# Patient Record
Sex: Female | Born: 1995 | Race: Black or African American | Hispanic: No | Marital: Single | Smoking: Never smoker
Health system: Southern US, Community
[De-identification: ages and names within clinical notes are randomized; demographics above are authoritative.]

## PROBLEM LIST (undated history)

## (undated) ENCOUNTER — Inpatient Hospital Stay (HOSPITAL_COMMUNITY): Payer: Self-pay

## (undated) DIAGNOSIS — Z62810 Personal history of physical and sexual abuse in childhood: Secondary | ICD-10-CM

## (undated) DIAGNOSIS — O358XX0 Maternal care for other (suspected) fetal abnormality and damage, not applicable or unspecified: Secondary | ICD-10-CM

## (undated) DIAGNOSIS — O1403 Mild to moderate pre-eclampsia, third trimester: Secondary | ICD-10-CM

## (undated) DIAGNOSIS — Z789 Other specified health status: Secondary | ICD-10-CM

## (undated) DIAGNOSIS — Z3490 Encounter for supervision of normal pregnancy, unspecified, unspecified trimester: Secondary | ICD-10-CM

## (undated) DIAGNOSIS — O163 Unspecified maternal hypertension, third trimester: Secondary | ICD-10-CM

## (undated) DIAGNOSIS — D649 Anemia, unspecified: Secondary | ICD-10-CM

## (undated) HISTORY — DX: Encounter for supervision of normal pregnancy, unspecified, unspecified trimester: Z34.90

## (undated) HISTORY — DX: Mild to moderate pre-eclampsia, third trimester: O14.03

## (undated) HISTORY — DX: Anemia, unspecified: D64.9

## (undated) HISTORY — DX: Personal history of physical and sexual abuse in childhood: Z62.810

## (undated) HISTORY — PX: OTHER SURGICAL HISTORY: SHX169

## (undated) HISTORY — PX: NO PAST SURGERIES: SHX2092

## (undated) HISTORY — DX: Unspecified maternal hypertension, third trimester: O16.3

## (undated) HISTORY — DX: Maternal care for other (suspected) fetal abnormality and damage, not applicable or unspecified: O35.8XX0

---
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
## 2016-11-24 ENCOUNTER — Ambulatory Visit (INDEPENDENT_AMBULATORY_CARE_PROVIDER_SITE_OTHER): Payer: Medicaid Other | Admitting: Physician Assistant

## 2016-11-24 ENCOUNTER — Encounter (INDEPENDENT_AMBULATORY_CARE_PROVIDER_SITE_OTHER): Payer: Self-pay | Admitting: Physician Assistant

## 2016-11-24 VITALS — BP 132/70 | HR 96 | Temp 97.9°F | Ht 70.87 in | Wt 172.0 lb

## 2016-11-24 DIAGNOSIS — Z0000 Encounter for general adult medical examination without abnormal findings: Secondary | ICD-10-CM

## 2016-11-24 DIAGNOSIS — Z23 Encounter for immunization: Secondary | ICD-10-CM

## 2016-11-24 DIAGNOSIS — K5900 Constipation, unspecified: Secondary | ICD-10-CM

## 2016-11-24 DIAGNOSIS — R42 Dizziness and giddiness: Secondary | ICD-10-CM | POA: Diagnosis not present

## 2016-11-24 DIAGNOSIS — Z309 Encounter for contraceptive management, unspecified: Secondary | ICD-10-CM

## 2016-11-24 MED ORDER — TETANUS-DIPHTH-ACELL PERTUSSIS 5-2-15.5 LF-MCG/0.5 IM SUSP: 0.5000 mL | Freq: Once | INTRAMUSCULAR | 0 refills | Status: AC

## 2016-11-24 MED ORDER — DOCUSATE SODIUM 50 MG PO CAPS: 50.0000 mg | ORAL_CAPSULE | Freq: Two times a day (BID) | ORAL | 0 refills | Status: DC

## 2016-11-24 MED ORDER — INFLUENZA VIRUS VACC SPLIT PF (FLUZONE) 0.5 ML IM SUSY: 1.0000 "application " | PREFILLED_SYRINGE | Freq: Once | INTRAMUSCULAR | 0 refills | Status: AC

## 2016-11-24 NOTE — Patient Instructions (Signed)
Constipation, Adult °Constipation is when a person: °· Poops (has a bowel movement) fewer times in a week than normal. °· Has a hard time pooping. °· Has poop that is dry, hard, or bigger than normal. ° °Follow these instructions at home: °Eating and drinking ° °· Eat foods that have a lot of fiber, such as: °? Fresh fruits and vegetables. °? Whole grains. °? Beans. °· Eat less of foods that are high in fat, low in fiber, or overly processed, such as: °? French fries. °? Hamburgers. °? Cookies. °? Candy. °? Soda. °· Drink enough fluid to keep your pee (urine) clear or pale yellow. °General instructions °· Exercise regularly or as told by your doctor. °· Go to the restroom when you feel like you need to poop. Do not hold it in. °· Take over-the-counter and prescription medicines only as told by your doctor. These include any fiber supplements. °· Do pelvic floor retraining exercises, such as: °? Doing deep breathing while relaxing your lower belly (abdomen). °? Relaxing your pelvic floor while pooping. °· Watch your condition for any changes. °· Keep all follow-up visits as told by your doctor. This is important. °Contact a doctor if: °· You have pain that gets worse. °· You have a fever. °· You have not pooped for 4 days. °· You throw up (vomit). °· You are not hungry. °· You lose weight. °· You are bleeding from the anus. °· You have thin, pencil-like poop (stool). °Get help right away if: °· You have a fever, and your symptoms suddenly get worse. °· You leak poop or have blood in your poop. °· Your belly feels hard or bigger than normal (is bloated). °· You have very bad belly pain. °· You feel dizzy or you faint. °This information is not intended to replace advice given to you by your health care provider. Make sure you discuss any questions you have with your health care provider. °Document Released: 06/24/2007 Document Revised: 07/26/2015 Document Reviewed: 06/26/2015 °Elsevier Interactive Patient Education ©  2017 [employer] Inc. ° °

## 2016-11-24 NOTE — Progress Notes (Signed)
Subjective:  Patient ID: Angel Hull, female    DOB: [birth year]  Age: 21 y.o. MRN: [id number]  CC: birth control  HPI Angel RackRobin Solinger is a 21 y.o. female with so significant medical history presents for an annual physical. Also wants an Nexplanon insertion. Only complaints are of occasional lightheadedness. Considers herself to have a normal flow during her period. Drinks one 16 oz bottle of water per day. Does not endorse any other complaints or symptoms.       Outpatient Medications Prior to Visit  Medication Sig Dispense Refill  . medroxyPROGESTERone (DEPO-PROVERA) 150 MG/ML injection Inject 1 mL (150 mg total) into the muscle every 3 (three) months. (Patient not taking: Reported on 11/24/2016) 1 mL 3  . Vitamin D, Ergocalciferol, (DRISDOL) 50000 UNITS CAPS capsule Take 1 capsule (50,000 Units total) by mouth 2 (two) times a week. (Patient not taking: Reported on 11/24/2016) 30 capsule 5   No facility-administered medications prior to visit.      ROS Review of Systems  Constitutional: Negative for chills, fever and malaise/fatigue.  Eyes: Negative for blurred vision.  Respiratory: Negative for shortness of breath.   Cardiovascular: Negative for chest pain and palpitations.  Gastrointestinal: Negative for abdominal pain and nausea.  Genitourinary: Negative for dysuria and hematuria.  Musculoskeletal: Negative for joint pain and myalgias.  Skin: Negative for rash.  Neurological: Positive for dizziness. Negative for tingling and headaches.  Psychiatric/Behavioral: Negative for depression. The patient is not nervous/anxious.     Objective:  BP 132/70 (BP Location: Right Arm, Patient Position: Sitting, Cuff Size: Normal)   Pulse 96   Temp 97.9 F (36.6 C) (Oral)   Ht 5' 10.87" (1.8 m)   Wt 172 lb (78 kg)   LMP 11/22/2016 (Exact Date)   SpO2 96%   Breastfeeding? No   BMI 24.08 kg/m   BP/Weight 11/24/2016 01/10/2015 01/02/2015  Systolic BP 132 142 130  Diastolic BP 70  67 84  Wt. (Lbs) 172 179.9 180.5  BMI 24.08 25.1 25.19      Physical Exam  Constitutional: She is oriented to person, place, and time.  Tall, lanky, NAD  HENT:  Head: Normocephalic and atraumatic.  Eyes: Conjunctivae and EOM are normal. No scleral icterus.  Neck: Normal range of motion. Neck supple. No thyromegaly present.  Cardiovascular: Normal rate, regular rhythm and normal heart sounds.  Pulmonary/Chest: Effort normal and breath sounds normal. No respiratory distress.  Abdominal: Soft. Bowel sounds are normal. There is no tenderness.  Musculoskeletal: She exhibits no edema.  Full aROM of the UEs, LEs, and back.  Lymphadenopathy:    She has no cervical adenopathy.  Neurological: She is alert and oriented to person, place, and time. She has normal reflexes. No cranial nerve deficit. Coordination normal.  Strength 5/5 throughout  Skin: Skin is warm and dry. No rash noted. No erythema. No pallor.  Psychiatric: She has a normal mood and affect. Her behavior is normal. Thought content normal.  Vitals reviewed.    Assessment & Plan:   1. Annual physical exam - CBC with Differential - Comprehensive metabolic panel  2. Constipation, unspecified constipation type - Advised to increase her water intake  - Begin Docusate 50 mg BID  3. Lightheadedness - Advised to increase her water intake  - CBC  4. Encounter for contraceptive management, unspecified type - Ambulatory referral to Obstetrics / Gynecology   Meds ordered this encounter  Medications  . docusate sodium (COLACE) 50 MG capsule    Sig: Take 1  capsule (50 mg total) 2 (two) times daily by mouth.    Dispense:  10 capsule    Refill:  0    Order Specific Question:   Supervising Provider    Answer:   Quentin AngstJEGEDE, OLUGBEMIGA E [[id number]]    Follow-up: PRN   Loletta Specteroger David Gomez PA

## 2016-11-25 LAB — CBC WITH DIFFERENTIAL/PLATELET
BASOS: 2 %
Basophils Absolute: 0 10*3/uL (ref 0.0–0.2)
EOS (ABSOLUTE): 0.1 10*3/uL (ref 0.0–0.4)
EOS: 3 %
HEMATOCRIT: 33.8 % — AB (ref 34.0–46.6)
HEMOGLOBIN: 10.7 g/dL — AB (ref 11.1–15.9)
IMMATURE GRANS (ABS): 0 10*3/uL (ref 0.0–0.1)
IMMATURE GRANULOCYTES: 0 %
LYMPHS: 46 %
Lymphocytes Absolute: 1.1 10*3/uL (ref 0.7–3.1)
MCH: 26.2 pg — ABNORMAL LOW (ref 26.6–33.0)
MCHC: 31.7 g/dL (ref 31.5–35.7)
MCV: 83 fL (ref 79–97)
MONOCYTES: 13 %
MONOS ABS: 0.3 10*3/uL (ref 0.1–0.9)
NEUTROS PCT: 36 %
Neutrophils Absolute: 0.9 10*3/uL — ABNORMAL LOW (ref 1.4–7.0)
Platelets: 294 10*3/uL (ref 150–379)
RBC: 4.08 x10E6/uL (ref 3.77–5.28)
RDW: 16.5 % — ABNORMAL HIGH (ref 12.3–15.4)
WBC: 2.5 10*3/uL — AB (ref 3.4–10.8)

## 2016-11-25 LAB — COMPREHENSIVE METABOLIC PANEL
ALBUMIN: 4.6 g/dL (ref 3.5–5.5)
ALK PHOS: 81 IU/L (ref 39–117)
ALT: 16 IU/L (ref 0–32)
AST: 20 IU/L (ref 0–40)
Albumin/Globulin Ratio: 1.8 (ref 1.2–2.2)
BUN / CREAT RATIO: 14 (ref 9–23)
BUN: 11 mg/dL (ref 6–20)
Bilirubin Total: 1.1 mg/dL (ref 0.0–1.2)
CALCIUM: 9.6 mg/dL (ref 8.7–10.2)
CO2: 24 mmol/L (ref 20–29)
CREATININE: 0.76 mg/dL (ref 0.57–1.00)
Chloride: 103 mmol/L (ref 96–106)
GFR calc Af Amer: 131 mL/min/{1.73_m2} (ref >59)
GFR, EST NON AFRICAN AMERICAN: 113 mL/min/{1.73_m2} (ref >59)
GLOBULIN, TOTAL: 2.5 g/dL (ref 1.5–4.5)
GLUCOSE: 82 mg/dL (ref 65–99)
Potassium: 4.1 mmol/L (ref 3.5–5.2)
Sodium: 142 mmol/L (ref 134–144)
Total Protein: 7.1 g/dL (ref 6.0–8.5)

## 2016-11-26 ENCOUNTER — Telehealth (INDEPENDENT_AMBULATORY_CARE_PROVIDER_SITE_OTHER): Payer: Self-pay

## 2016-11-26 NOTE — Telephone Encounter (Signed)
Left message asking patient to call the office. Tempestt S Roberts, CMA  

## 2016-11-26 NOTE — Telephone Encounter (Signed)
-----   Message from Roger David Gomez, PA-C sent at 11/25/2016  5:40 PM EST ----- Mildly anemic and with decreased white blood cells. Please take OTC Gentle Iron pills. Will repeat WBCs at a future appointment. 

## 2016-11-26 NOTE — Telephone Encounter (Signed)
-----   Message from Loletta Specteroger David Gomez, PA-C sent at 11/25/2016  5:40 PM EST ----- Mildly anemic and with decreased white blood cells. Please take OTC Gentle Iron pills. Will repeat WBCs at a future appointment.

## 2016-11-26 NOTE — Telephone Encounter (Signed)
Patient aware of lab results and to get OTC gentle iron pills. Maryjean Mornempestt S Mitra Duling, CMA

## 2016-12-07 ENCOUNTER — Telehealth (HOSPITAL_COMMUNITY): Payer: Self-pay

## 2016-12-07 NOTE — Telephone Encounter (Signed)
Received a referral on this patient, called her to schedule an appointment, no answer, left voicemail instructing patient to return my phone call at the office.

## 2017-04-24 ENCOUNTER — Ambulatory Visit (HOSPITAL_COMMUNITY)
Admission: EM | Admit: 2017-04-24 | Discharge: 2017-04-24 | Disposition: A | Discharge status: Home / Self Care | Payer: Medicaid Other | Attending: Internal Medicine | Admitting: Internal Medicine

## 2017-04-24 ENCOUNTER — Encounter (HOSPITAL_COMMUNITY): Payer: Self-pay | Admitting: Emergency Medicine

## 2017-04-24 ENCOUNTER — Other Ambulatory Visit: Payer: Self-pay

## 2017-04-24 DIAGNOSIS — R05 Cough: Secondary | ICD-10-CM | POA: Diagnosis not present

## 2017-04-24 DIAGNOSIS — J020 Streptococcal pharyngitis: Secondary | ICD-10-CM | POA: Diagnosis not present

## 2017-04-24 DIAGNOSIS — J029 Acute pharyngitis, unspecified: Secondary | ICD-10-CM | POA: Diagnosis not present

## 2017-04-24 LAB — POCT RAPID STREP A: STREPTOCOCCUS, GROUP A SCREEN (DIRECT): POSITIVE — AB

## 2017-04-24 MED ORDER — AMOXICILLIN 500 MG PO CAPS: 500.0000 mg | ORAL_CAPSULE | Freq: Three times a day (TID) | ORAL | 0 refills | Status: AC

## 2017-04-24 MED ORDER — AMOXICILLIN 500 MG PO CAPS: 500.0000 mg | ORAL_CAPSULE | Freq: Three times a day (TID) | ORAL | 0 refills | Status: DC

## 2017-04-24 NOTE — ED Triage Notes (Signed)
Olivia, pa in treatment room.  Initially a cold, now severe sore throat
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
## 2017-11-17 ENCOUNTER — Ambulatory Visit (HOSPITAL_COMMUNITY)
Admission: EM | Admit: 2017-11-17 | Discharge: 2017-11-17 | Disposition: A | Discharge status: Home / Self Care | Payer: Medicaid Other | Attending: Family Medicine | Admitting: Family Medicine

## 2017-11-17 ENCOUNTER — Emergency Department (HOSPITAL_COMMUNITY)
Admission: EM | Admit: 2017-11-17 | Discharge: 2017-11-17 | Disposition: A | Discharge status: Home / Self Care | Payer: Medicaid Other | Attending: Emergency Medicine | Admitting: Emergency Medicine

## 2017-11-17 ENCOUNTER — Encounter (HOSPITAL_COMMUNITY): Payer: Self-pay | Admitting: Emergency Medicine

## 2017-11-17 DIAGNOSIS — Z3202 Encounter for pregnancy test, result negative: Secondary | ICD-10-CM | POA: Diagnosis not present

## 2017-11-17 DIAGNOSIS — N390 Urinary tract infection, site not specified: Secondary | ICD-10-CM | POA: Diagnosis not present

## 2017-11-17 DIAGNOSIS — R55 Syncope and collapse: Secondary | ICD-10-CM

## 2017-11-17 DIAGNOSIS — N3001 Acute cystitis with hematuria: Secondary | ICD-10-CM

## 2017-11-17 LAB — WET PREP, GENITAL
Clue Cells Wet Prep HPF POC: NONE SEEN
SPERM: NONE SEEN
Trich, Wet Prep: NONE SEEN
Yeast Wet Prep HPF POC: NONE SEEN

## 2017-11-17 LAB — URINALYSIS, ROUTINE W REFLEX MICROSCOPIC
BILIRUBIN URINE: NEGATIVE
GLUCOSE, UA: NEGATIVE mg/dL
KETONES UR: NEGATIVE mg/dL
NITRITE: NEGATIVE
PROTEIN: 100 mg/dL — AB
Specific Gravity, Urine: 1.008 (ref 1.005–1.030)
pH: 6 (ref 5.0–8.0)

## 2017-11-17 LAB — POCT PREGNANCY, URINE: Preg Test, Ur: NEGATIVE

## 2017-11-17 LAB — BASIC METABOLIC PANEL WITH GFR
Anion gap: 8 (ref 5–15)
BUN: 8 mg/dL (ref 6–20)
CO2: 24 mmol/L (ref 22–32)
Calcium: 9.5 mg/dL (ref 8.9–10.3)
Chloride: 106 mmol/L (ref 98–111)
Creatinine, Ser: 0.76 mg/dL (ref 0.44–1.00)
GFR calc Af Amer: >60 mL/min
GFR calc non Af Amer: >60 mL/min
Glucose, Bld: 83 mg/dL (ref 70–99)
Potassium: 4.1 mmol/L (ref 3.5–5.1)
Sodium: 138 mmol/L (ref 135–145)

## 2017-11-17 LAB — CBC
HCT: 40.1 % (ref 36.0–46.0)
Hemoglobin: 13.1 g/dL (ref 12.0–15.0)
MCH: 29.6 pg (ref 26.0–34.0)
MCHC: 32.7 g/dL (ref 30.0–36.0)
MCV: 90.5 fL (ref 80.0–100.0)
Platelets: 208 10*3/uL (ref 150–400)
RBC: 4.43 MIL/uL (ref 3.87–5.11)
RDW: 12 % (ref 11.5–15.5)
WBC: 3.7 10*3/uL — ABNORMAL LOW (ref 4.0–10.5)
nRBC: 0 % (ref 0.0–0.2)

## 2017-11-17 LAB — POCT I-STAT, CHEM 8
BUN: 8 mg/dL (ref 6–20)
CHLORIDE: 104 mmol/L (ref 98–111)
CREATININE: 0.8 mg/dL (ref 0.44–1.00)
Calcium, Ion: 1.2 mmol/L (ref 1.15–1.40)
GLUCOSE: 106 mg/dL — AB (ref 70–99)
HCT: 39 % (ref 36.0–46.0)
Hemoglobin: 13.3 g/dL (ref 12.0–15.0)
POTASSIUM: 3.6 mmol/L (ref 3.5–5.1)
Sodium: 140 mmol/L (ref 135–145)
TCO2: 25 mmol/L (ref 22–32)

## 2017-11-17 LAB — POCT URINALYSIS DIP (DEVICE)
Glucose, UA: 100 mg/dL — AB
Ketones, ur: 15 mg/dL — AB
Nitrite: POSITIVE — AB
Protein, ur: >=300 mg/dL — AB
Specific Gravity, Urine: 1.02 (ref 1.005–1.030)
Urobilinogen, UA: 4 mg/dL — ABNORMAL HIGH (ref 0.0–1.0)
pH: 5 (ref 5.0–8.0)

## 2017-11-17 LAB — I-STAT BETA HCG BLOOD, ED (MC, WL, AP ONLY): I-stat hCG, quantitative: <5 m[IU]/mL (ref <5)

## 2017-11-17 MED ORDER — CEPHALEXIN 500 MG PO CAPS: 500.0000 mg | ORAL_CAPSULE | Freq: Four times a day (QID) | ORAL | 0 refills | Status: DC

## 2017-11-17 MED ORDER — SODIUM CHLORIDE 0.9 % IV SOLN
1.0000 g | Freq: Once | INTRAVENOUS | Status: AC
  Administered 2017-11-17: 1 g via INTRAVENOUS
  Filled 2017-11-17: qty 10

## 2017-11-17 MED ORDER — SODIUM CHLORIDE 0.9 % IV BOLUS
1000.0000 mL | Freq: Once | INTRAVENOUS | Status: AC
  Administered 2017-11-17: 1000 mL via INTRAVENOUS

## 2017-11-17 NOTE — ED Notes (Signed)
Called for room with no response from lobby ° °
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
## 2017-11-17 NOTE — ED Provider Notes (Signed)
[facility]   [id number] 11/17/17 Arrival Time: 1221  CC: Syncopal episode  SUBJECTIVE:  Angel Hull is a 22 y.o. female who presents with complaint of 2 syncopal episodes that occurred last night and this morning.  Denies a precipitating event or prodrome (nausea, sweating, feeling hot and clammy).  First episode occurred while her kids were tickling her.  Second episode, witnessed by mother, occurred while standing and talking to mother.  Mother states episode lasted approximately 2 minutes.  Describes the syncopal episodes as feeling lightheaded.  States that it is intermittent with episodes lasting 2-5 minutes.  Upon further questioning, patient reports fasting prior to syncopal episodes.  Patient also mentions not drinking "enough" water.  Also currently on her menstrual cycle, but denies excessive bleeding.   Symptoms improved with eating.  Admits to previous symptoms in the past and told her blood sugar was low.  Complains of associated fatigue and nausea.  Denies fever, chills, vomiting, feeling cold or clammy, visual changes or blurry vision, palpitations, chest pain, SOB, weakness, confusion, slurred speech, memory or emotional changes, facial drooping/ asymmetry, incoordination, numbness or tingling, abdominal pain, changes in bowel or bladder habits.    ROS: As per HPI. Past Medical History:  Diagnosis Date  . Medical history non-contributory    Past Surgical History:  Procedure Laterality Date  . NO PAST SURGERIES     No Known Allergies No current facility-administered medications on file prior to encounter.    Current Outpatient Medications on File Prior to Encounter  Medication Sig Dispense Refill  . docusate sodium (COLACE) 50 MG capsule Take 1 capsule (50 mg total) 2 (two) times daily by mouth. 10 capsule 0   Social History   Socioeconomic History  . Marital status: Single    Spouse name: Not on file  . Number of children: Not on file  . Years of  education: Not on file  . Highest education level: Not on file  Occupational History  . Not on file  Social Needs  . Financial resource strain: Not on file  . Food insecurity:    Worry: Not on file    Inability: Not on file  . Transportation needs:    Medical: Not on file    Non-medical: Not on file  Tobacco Use  . Smoking status: Never Smoker  . Smokeless tobacco: Never Used  Substance and Sexual Activity  . Alcohol use: No  . Drug use: No  . Sexual activity: Not Currently  Lifestyle  . Physical activity:    Days per week: Not on file    Minutes per session: Not on file  . Stress: Not on file  Relationships  . Social connections:    Talks on phone: Not on file    Gets together: Not on file    Attends religious service: Not on file    Active member of club or organization: Not on file    Attends meetings of clubs or organizations: Not on file    Relationship status: Not on file  . Intimate partner violence:    Fear of current or ex partner: Not on file    Emotionally abused: Not on file    Physically abused: Not on file    Forced sexual activity: Not on file  Other Topics Concern  . Not on file  Social History Narrative  . Not on file   Family History  Problem Relation Age of Onset  . Hypertension Mother     OBJECTIVE:  Vitals:   11/17/17 1247  BP: 128/87  Pulse: 93  Resp: 18  Temp: 98.2 F (36.8 C)  TempSrc: Oral  SpO2: 97%    General appearance: alert; no distress; nontoxic appearance Eyes: PERRLA; EOMI; conjunctiva normal HENT: normocephalic; atraumatic; TMs normal; nasal mucosa normal; oral mucosa normal Neck: supple with FROM Lungs: clear to auscultation bilaterally Heart: regular rate and rhythm Abdomen: soft, non-tender; bowel sounds normal; no CVA tenderness Extremities: no cyanosis or edema; symmetrical with no gross deformities Skin: warm and dry Neurologic: normal gait; CN 2-12 grossly intact; strength and sensation intact about the lower  and upper extremities; negative pronator drift; finger to nose without difficulty Psychological: alert and cooperative; normal mood and affect   EKG:  EKG normal sinus rhythm without ST elevations, depressions, or prolonged PR interval.  No narrowing or widening of the QRS complexes.  Reviewed EKG with Dr. Tracie Harrier.     Labs:  Results for orders placed or performed during the hospital encounter of 11/17/17 (from the past 24 hour(s))  POCT urinalysis dip (device)     Status: Abnormal   Collection Time: 11/17/17  1:01 PM  Result Value Ref Range   Glucose, UA 100 (A) NEGATIVE mg/dL   Bilirubin Urine LARGE (A) NEGATIVE   Ketones, ur 15 (A) NEGATIVE mg/dL   Specific Gravity, Urine 1.020 1.005 - 1.030   Hgb urine dipstick LARGE (A) NEGATIVE   pH 5.0 5.0 - 8.0   Protein, ur >=300 (A) NEGATIVE mg/dL   Urobilinogen, UA 4.0 (H) 0.0 - 1.0 mg/dL   Nitrite POSITIVE (A) NEGATIVE   Leukocytes, UA LARGE (A) NEGATIVE  I-STAT, chem 8     Status: Abnormal   Collection Time: 11/17/17  1:07 PM  Result Value Ref Range   Sodium 140 135 - 145 mmol/L   Potassium 3.6 3.5 - 5.1 mmol/L   Chloride 104 98 - 111 mmol/L   BUN 8 6 - 20 mg/dL   Creatinine, Ser 4.09 0.44 - 1.00 mg/dL   Glucose, Bld 811 (H) 70 - 99 mg/dL   Calcium, Ion 9.14 1.15 - 1.40 mmol/L   TCO2 25 22 - 32 mmol/L   Hemoglobin 13.3 12.0 - 15.0 g/dL   HCT 78.2 95.6 - 21.3 %  Pregnancy, urine POC     Status: None   Collection Time: 11/17/17  1:08 PM  Result Value Ref Range   Preg Test, Ur NEGATIVE NEGATIVE   Orders placed or performed during the hospital encounter of 11/17/17  . ED EKG  . ED EKG     ASSESSMENT & PLAN:  1. Syncope, unspecified syncope type   2. Acute cystitis with hematuria     No orders of the defined types were placed in this encounter.  Hx of syncope.  VS and exam WNL.  Urine positive for infection.  Urine pregnancy negative.  I-chem WNL.  EKG WNL.   Recommending further evaluation and management of syncopal  episodes Patient aware and in agreement with this plan.  Will go to ER with mother.    Reviewed expectations re: course of current medical issues. Questions answered. Outlined signs and symptoms indicating need for more acute intervention. Patient verbalized understanding. After Visit Summary given.    Rennis Harding, PA-C 11/17/17 1511

## 2017-11-17 NOTE — Discharge Instructions (Addendum)
Recommending further evaluation and management of syncopal episodes Patient aware and in agreement with this plan.  Will go to ER with mother.

## 2017-11-17 NOTE — ED Triage Notes (Signed)
Pt presents to [facility] for episodes of extreme fatigue, and an episode of syncope earlier today.  States she passed out last night and also this morning.  Denies head injury, denies pain from fall.

## 2017-11-17 NOTE — ED Provider Notes (Signed)
Patient placed in Quick Look pathway, seen and evaluated   Chief Complaint: loss of conciousness  HPI:   Pt has had multiple episodes of passing out  ROS: no fever, no chills  Physical Exam:   Gen: No distress  Neuro: Awake and Alert  Skin: Warm    Focused Exam: Lungs clear Heart RRR   Initiation of care has begun. The patient has been counseled on the process, plan, and necessity for staying for the completion/evaluation, and the remainder of the medical screening examination   Angel Hull 11/17/17 1502    Gerhard Munch, MD 11/18/17 [phone]

## 2017-11-17 NOTE — ED Notes (Signed)
Spoke to [place] about patient's symptoms and will follow-up

## 2017-11-17 NOTE — Discharge Instructions (Signed)
Stay very well hydrated with plenty of water throughout the day. Please take antibiotic until completion. Follow up with primary care physician in 1 week for recheck of ongoing symptoms.  Please seek immediate care if you develop the following: Your symptoms are no better or worse in 3 days. There is severe back pain or lower abdominal pain.  You develop a fever.  There is nausea or vomiting.  New or worsening symptoms develop You have any additional concerns.

## 2017-11-17 NOTE — ED Triage Notes (Signed)
Pt was sent here from Urgent care for further work up due to frequent syncope events. Pt has EKG, UA and chem 8 done at UC just PTA. Pt states she has had a issue with passing out in the past from not eating or being stressed but has been having frequent episodes with last syncope events this morning. Pt denies any pain, alert and ox4- in no distress, currently on cell phone. Pt states she is unsure if she is "falling asleep or passing out". Pt states she feels sleepy all the time.

## 2017-11-18 LAB — GC/CHLAMYDIA PROBE AMP (~~LOC~~) NOT AT ARMC
Chlamydia: NEGATIVE
Neisseria Gonorrhea: NEGATIVE

## 2017-12-02 ENCOUNTER — Other Ambulatory Visit (INDEPENDENT_AMBULATORY_CARE_PROVIDER_SITE_OTHER): Payer: Self-pay | Admitting: Physician Assistant

## 2017-12-02 ENCOUNTER — Ambulatory Visit (INDEPENDENT_AMBULATORY_CARE_PROVIDER_SITE_OTHER): Payer: Medicaid Other | Admitting: Physician Assistant

## 2017-12-02 ENCOUNTER — Encounter (INDEPENDENT_AMBULATORY_CARE_PROVIDER_SITE_OTHER): Payer: Self-pay | Admitting: Physician Assistant

## 2017-12-02 ENCOUNTER — Other Ambulatory Visit: Payer: Self-pay

## 2017-12-02 VITALS — BP 137/87 | HR 69 | Temp 97.7°F | Ht 71.0 in | Wt 198.0 lb

## 2017-12-02 DIAGNOSIS — R55 Syncope and collapse: Secondary | ICD-10-CM

## 2017-12-02 DIAGNOSIS — N6320 Unspecified lump in the left breast, unspecified quadrant: Secondary | ICD-10-CM | POA: Diagnosis not present

## 2017-12-02 DIAGNOSIS — Z09 Encounter for follow-up examination after completed treatment for conditions other than malignant neoplasm: Secondary | ICD-10-CM

## 2017-12-02 DIAGNOSIS — Z87440 Personal history of urinary (tract) infections: Secondary | ICD-10-CM

## 2017-12-02 LAB — POCT URINALYSIS DIPSTICK
Bilirubin, UA: NEGATIVE
Blood, UA: NEGATIVE
Glucose, UA: NEGATIVE
Ketones, UA: 15
LEUKOCYTES UA: NEGATIVE
Nitrite, UA: NEGATIVE
PH UA: 7 (ref 5.0–8.0)
PROTEIN UA: POSITIVE — AB
Spec Grav, UA: 1.015 (ref 1.010–1.025)
UROBILINOGEN UA: 1 U/dL

## 2017-12-02 NOTE — Patient Instructions (Signed)
Fibroadenoma A fibroadenoma is a lump (tumor) in the breast. The lump is not cancer (is benign). It may move under your skin when you touch it. This kind of lump can grow in one breast or in both breasts. Follow these instructions at home:  If you had a lump removed, follow instructions from your doctor for home care after the procedure.  Check your breasts at home as told by your doctor.  Keep all follow-up visits as told by your doctor. This is important. Contact a doctor if:  The lump changes in size or feels different.  The lump starts to be painful.  You find a new lump.  You have any changes in the skin that covers your breast.  You have any changes in your nipple.  You have fluid leaking from your nipple. This information is not intended to replace advice given to you by your health care provider. Make sure you discuss any questions you have with your health care provider. Document Released: 04/03/2008 Document Revised: 06/13/2015 Document Reviewed: 12/27/2013 Elsevier Interactive Patient Education  2018 Elsevier Inc.  

## 2017-12-02 NOTE — Progress Notes (Signed)
Subjective:  Patient ID: Angel Hull, female    DOB: 02-10-1995  Age: 22 y.o. MRN: [id number]  CC:   HPI Kyomi Littlepage is a 22 y.o. female with a medical history of Vit D deficiency and constipation presents on ED f/u. Went to ED two weeks ago after having visited urgent care for syncopal episodes with LOC. Pt also had associated symptoms of nausea, fatigue, and urinary frequency. EKG from urgent care reviewed and was NSR. BMP and HCG normal/negative. UA revealed large leukocytes. No urine culture performed. Pt discharged on cephalexin which resolved her nausea, polyuria, fatigue, and syncope. Does not endorse any other symptoms besides a left breast lump.     Pt complains of left breast lump. Had reportedly been imaged before and was told the lump was benign. Feels breast lump becomes tender during her menses. Denies redness, nipple discharge, or skin texture changes.    Outpatient Medications Prior to Visit  Medication Sig Dispense Refill  . cephALEXin (KEFLEX) 500 MG capsule Take 1 capsule (500 mg total) by mouth 4 (four) times daily. 40 capsule 0  . docusate sodium (COLACE) 50 MG capsule Take 1 capsule (50 mg total) 2 (two) times daily by mouth. (Patient not taking: Reported on 11/17/2017) 10 capsule 0   No facility-administered medications prior to visit.      ROS Review of Systems  Constitutional: Negative for chills, fever and malaise/fatigue.  Eyes: Negative for blurred vision.  Respiratory: Negative for shortness of breath.   Cardiovascular: Negative for chest pain and palpitations.  Gastrointestinal: Negative for abdominal pain and nausea.  Genitourinary: Negative for dysuria and hematuria.  Musculoskeletal: Negative for joint pain and myalgias.  Skin: Negative for rash.  Neurological: Negative for tingling and headaches.  Psychiatric/Behavioral: Negative for depression. The patient is not nervous/anxious.     Objective:  BP 137/87 (BP Location: Right Arm, Patient  Position: Sitting, Cuff Size: Normal)   Pulse 69   Temp 97.7 F (36.5 C) (Oral)   Ht 5\' 11"  (1.803 m)   Wt 198 lb (89.8 kg)   LMP 11/17/2017   SpO2 100%   BMI 27.62 kg/m   BP/Weight 12/02/2017 11/17/2017 11/17/2017  Systolic BP 137 135 128  Diastolic BP 87 84 87  Wt. (Lbs) 198 - -  BMI 27.62 - -      Physical Exam  Constitutional: She is oriented to person, place, and time.  Well developed, well nourished, NAD, polite  HENT:  Head: Normocephalic and atraumatic.  Eyes: No scleral icterus.  Neck: Normal range of motion. Neck supple. No thyromegaly present.  Cardiovascular: Normal rate, regular rhythm and normal heart sounds.  Pulmonary/Chest: Effort normal and breath sounds normal.  Abdominal: Soft. Bowel sounds are normal. There is no tenderness.  Genitourinary:  Genitourinary Comments: No CVA tenderness bilaterally  Musculoskeletal: She exhibits no edema.  Neurological: She is alert and oriented to person, place, and time.  Skin: Skin is warm and dry. No rash noted. No erythema. No pallor.  Psychiatric: She has a normal mood and affect. Her behavior is normal. Thought content normal.  Vitals reviewed.    Assessment & Plan:   1. Hospital discharge follow-up - Notes reviewed  2. History of UTI - Urinalysis Dipstick neg for nitrites and leukocytes  3. Syncope and collapse - 2/2 UTI. No further episodes of syncope after tx of UTI with cephalexin.  4. Left breast lump - MM Digital Diagnostic Unilat L; Future     Follow-up: Return if symptoms worsen  or fail to improve.   Loletta Specteroger David Gomez PA

## 2017-12-06 ENCOUNTER — Ambulatory Visit (INDEPENDENT_AMBULATORY_CARE_PROVIDER_SITE_OTHER): Payer: Medicaid Other | Admitting: Physician Assistant

## 2018-01-02 ENCOUNTER — Encounter: Payer: Self-pay | Admitting: Emergency Medicine

## 2018-01-02 ENCOUNTER — Emergency Department
Admission: EM | Admit: 2018-01-02 | Discharge: 2018-01-02 | Disposition: A | Discharge status: Home / Self Care | Payer: Medicaid Other | Attending: Emergency Medicine | Admitting: Emergency Medicine

## 2018-01-02 DIAGNOSIS — N898 Other specified noninflammatory disorders of vagina: Secondary | ICD-10-CM | POA: Diagnosis not present

## 2018-01-02 DIAGNOSIS — N3000 Acute cystitis without hematuria: Secondary | ICD-10-CM | POA: Diagnosis not present

## 2018-01-02 DIAGNOSIS — R350 Frequency of micturition: Secondary | ICD-10-CM | POA: Diagnosis present

## 2018-01-02 LAB — URINALYSIS, COMPLETE (UACMP) WITH MICROSCOPIC
BILIRUBIN URINE: NEGATIVE
Bacteria, UA: NONE SEEN
Glucose, UA: NEGATIVE mg/dL
Hgb urine dipstick: NEGATIVE
KETONES UR: 5 mg/dL — AB
Nitrite: NEGATIVE
PH: 6 (ref 5.0–8.0)
Protein, ur: NEGATIVE mg/dL
Specific Gravity, Urine: 1.025 (ref 1.005–1.030)
Squamous Epithelial / LPF: >50 — ABNORMAL HIGH (ref 0–5)

## 2018-01-02 LAB — CHLAMYDIA/NGC RT PCR (ARMC ONLY)
CHLAMYDIA TR: NOT DETECTED
N gonorrhoeae: NOT DETECTED

## 2018-01-02 LAB — WET PREP, GENITAL
CLUE CELLS WET PREP: NONE SEEN
SPERM: NONE SEEN
Trich, Wet Prep: NONE SEEN
Yeast Wet Prep HPF POC: NONE SEEN

## 2018-01-02 NOTE — ED Triage Notes (Signed)
Pt to ED with c/o of frequent urination. Pt denies pain or discomfort. Pt states vaginal discharge that she says looks like "snot".

## 2018-01-02 NOTE — Discharge Instructions (Signed)
Follow-up with your primary care provider if any continued problems.  Your cultures will not be read for several more hours.  You may be called tomorrow morning if anything is positive with further instructions.  No sexual activity until cultures have been obtained.  Increase water and decrease any beverages with containing caffeine.
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
## 2018-01-02 NOTE — ED Triage Notes (Signed)
First Nurse Note:  C/o urinary frequency and burning with urination.  Patient is AAOx3.  Skin warm and dry. NAD

## 2018-01-03 LAB — POCT PREGNANCY, URINE: PREG TEST UR: NEGATIVE

## 2018-03-07 ENCOUNTER — Encounter: Payer: Self-pay | Admitting: Emergency Medicine

## 2018-03-07 ENCOUNTER — Emergency Department
Admission: EM | Admit: 2018-03-07 | Discharge: 2018-03-07 | Disposition: A | Discharge status: Home / Self Care | Payer: Medicaid Other | Attending: Emergency Medicine | Admitting: Emergency Medicine

## 2018-03-07 ENCOUNTER — Other Ambulatory Visit: Payer: Self-pay

## 2018-03-07 DIAGNOSIS — R55 Syncope and collapse: Secondary | ICD-10-CM | POA: Diagnosis not present

## 2018-03-07 LAB — BASIC METABOLIC PANEL
ANION GAP: 8 (ref 5–15)
BUN: 11 mg/dL (ref 6–20)
CO2: 23 mmol/L (ref 22–32)
CREATININE: 0.76 mg/dL (ref 0.44–1.00)
Calcium: 9.1 mg/dL (ref 8.9–10.3)
Chloride: 104 mmol/L (ref 98–111)
GFR calc non Af Amer: >60 mL/min (ref >60)
Glucose, Bld: 92 mg/dL (ref 70–99)
Potassium: 4 mmol/L (ref 3.5–5.1)
Sodium: 135 mmol/L (ref 135–145)

## 2018-03-07 LAB — CBC
HCT: 40.7 % (ref 36.0–46.0)
Hemoglobin: 13.3 g/dL (ref 12.0–15.0)
MCH: 30.3 pg (ref 26.0–34.0)
MCHC: 32.7 g/dL (ref 30.0–36.0)
MCV: 92.7 fL (ref 80.0–100.0)
NRBC: 0 % (ref 0.0–0.2)
Platelets: 241 10*3/uL (ref 150–400)
RBC: 4.39 MIL/uL (ref 3.87–5.11)
RDW: 11.9 % (ref 11.5–15.5)
WBC: 5 10*3/uL (ref 4.0–10.5)

## 2018-03-07 LAB — URINALYSIS, COMPLETE (UACMP) WITH MICROSCOPIC
BACTERIA UA: NONE SEEN
BILIRUBIN URINE: NEGATIVE
GLUCOSE, UA: NEGATIVE mg/dL
HGB URINE DIPSTICK: NEGATIVE
Ketones, ur: NEGATIVE mg/dL
LEUKOCYTE UA: NEGATIVE
Nitrite: NEGATIVE
PROTEIN: NEGATIVE mg/dL
Specific Gravity, Urine: 1.001 — ABNORMAL LOW (ref 1.005–1.030)
WBC UA: NONE SEEN WBC/hpf (ref 0–5)
pH: 6 (ref 5.0–8.0)

## 2018-03-07 LAB — POCT PREGNANCY, URINE: Preg Test, Ur: NEGATIVE

## 2018-03-07 NOTE — ED Triage Notes (Signed)
Pt c/o syncopal episodes x40months. PT states last syncopal episode was last night, denies any head injury. PT states increased fatigue and weakness. Hx of anemia .  PT A&Ox4
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
## 2018-03-23 ENCOUNTER — Ambulatory Visit (INDEPENDENT_AMBULATORY_CARE_PROVIDER_SITE_OTHER): Payer: Medicaid Other | Admitting: Primary Care

## 2018-03-23 ENCOUNTER — Other Ambulatory Visit: Payer: Self-pay

## 2018-03-23 ENCOUNTER — Encounter (INDEPENDENT_AMBULATORY_CARE_PROVIDER_SITE_OTHER): Payer: Self-pay | Admitting: Primary Care

## 2018-03-23 VITALS — BP 127/83 | HR 101 | Temp 98.1°F | Ht 71.0 in | Wt 204.6 lb

## 2018-03-23 DIAGNOSIS — Z09 Encounter for follow-up examination after completed treatment for conditions other than malignant neoplasm: Secondary | ICD-10-CM

## 2018-03-23 DIAGNOSIS — G43111 Migraine with aura, intractable, with status migrainosus: Secondary | ICD-10-CM

## 2018-03-23 DIAGNOSIS — R55 Syncope and collapse: Secondary | ICD-10-CM | POA: Diagnosis not present

## 2018-03-23 DIAGNOSIS — Z23 Encounter for immunization: Secondary | ICD-10-CM
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
## 2018-03-23 NOTE — Patient Instructions (Signed)
Syncope Syncope is when you pass out (faint) for a short time. It is caused by a sudden decrease in blood flow to the brain. Signs that you may be about to pass out include:  Feeling dizzy or light-headed.  Feeling sick to your stomach (nauseous).  Seeing all white or all black.  Having cold, clammy skin. If you pass out, get help right away. Call your local emergency services (911 in the [place] Do not drive yourself to the hospital. Follow these instructions at home: Watch for any changes in your symptoms. Take these actions to stay safe and help with your symptoms: Lifestyle  Do not drive, use machinery, or play sports until your doctor says it is okay.  Do not drink alcohol.  Do not use any products that contain nicotine or tobacco, such as cigarettes and e-cigarettes. If you need help quitting, ask your doctor.  Drink enough fluid to keep your pee (urine) pale yellow. General instructions  Take over-the-counter and prescription medicines only as told by your doctor.  If you are taking blood pressure or heart medicine, sit up and stand up slowly. Spend a few minutes getting ready to sit and then stand. This can help you feel less dizzy.  Have someone stay with you until you feel stable.  If you start to feel like you might pass out, lie down right away and raise (elevate) your feet above the level of your heart. Breathe deeply and steadily. Wait until all of the symptoms are gone.  Keep all follow-up visits as told by your doctor. This is important. Get help right away if:  You have a very bad headache.  You pass out once or more than once.  You have pain in your chest, belly, or back.  You have a very fast or uneven heartbeat (palpitations).  It hurts to breathe.  You are bleeding from your mouth or your bottom (rectum).  You have black or tarry poop (stool).  You have jerky movements that you cannot control (seizure).  You are confused.  You have trouble  walking.  You are very weak.  You have vision problems. These symptoms may be an emergency. Do not wait to see if the symptoms will go away. Get medical help right away. Call your local emergency services (911 in the [place] Do not drive yourself to the hospital. Summary  Syncope is when you pass out (faint) for a short time. It is caused by a sudden decrease in blood flow to the brain.  Signs that you may be about to faint include feeling dizzy, light-headed, or sick to your stomach, seeing all white or all black, or having cold, clammy skin.  If you start to feel like you might pass out, lie down right away and raise (elevate) your feet above the level of your heart. Breathe deeply and steadily. Wait until all of the symptoms are gone. This information is not intended to replace advice given to you by your health care provider. Make sure you discuss any questions you have with your health care provider. Document Released: 06/24/2007 Document Revised: 02/17/2017 Document Reviewed: 02/17/2017 Elsevier Interactive Patient Education  2019 Elsevier Inc.  

## 2018-03-29 ENCOUNTER — Ambulatory Visit (INDEPENDENT_AMBULATORY_CARE_PROVIDER_SITE_OTHER): Payer: Medicaid Other | Admitting: Cardiology

## 2018-03-29 ENCOUNTER — Encounter: Payer: Self-pay | Admitting: Cardiology

## 2018-03-29 VITALS — BP 140/85 | HR 120 | Ht 71.0 in | Wt 205.6 lb

## 2018-03-29 DIAGNOSIS — R55 Syncope and collapse: Secondary | ICD-10-CM | POA: Diagnosis not present

## 2018-03-29 DIAGNOSIS — R079 Chest pain, unspecified: Secondary | ICD-10-CM | POA: Diagnosis not present

## 2018-03-29 DIAGNOSIS — R0600 Dyspnea, unspecified: Secondary | ICD-10-CM | POA: Diagnosis not present

## 2018-03-29 DIAGNOSIS — Z87898 Personal history of other specified conditions: Secondary | ICD-10-CM | POA: Insufficient documentation
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
## 2018-03-29 NOTE — Patient Instructions (Addendum)
Medication Instructions:  Your physician recommends that you continue on your current medications as directed. Please refer to the Current Medication list given to you today.  If you need a refill on your cardiac medications before your next appointment, please call your pharmacy.   Lab work: None If you have labs (blood work) drawn today and your tests are completely normal, you will receive your results only by: Marland Kitchen MyChart Message (if you have MyChart) OR . A paper copy in the mail If you have any lab test that is abnormal or we need to change your treatment, we will call you to review the results.  Testing/Procedures: Your physician has requested that you have an echocardiogram. Echocardiography is a painless test that uses sound waves to create images of your heart. It provides your doctor with information about the size and shape of your heart and how well your heart's chambers and valves are working. This procedure takes approximately one hour. There are no restrictions for this procedure.  Your physician has recommended that you wear an event monitor. Event monitors are medical devices that record the heart's electrical activity. Doctors most often [place] these monitors to diagnose arrhythmias. Arrhythmias are problems with the speed or rhythm of the heartbeat. The monitor is a small, portable device. You can wear one while you do your normal daily activities. This is usually used to diagnose what is causing palpitations/syncope (passing out).  Follow-Up: Your physician recommends that you schedule a follow-up appointment in: 4 weeks with the PA.

## 2018-03-29 NOTE — Addendum Note (Signed)
Addended by: Dustin Flock on: 03/29/2018 04:08 PM   Modules accepted: Orders

## 2018-04-14 ENCOUNTER — Telehealth: Payer: Self-pay | Admitting: *Deleted

## 2018-04-14 NOTE — Telephone Encounter (Signed)
Patient call to cancel 04/18/18 appointment for her cardiac event monitor due to COVID-19 precautions.  Offered to have [employer] ship a 30 day cardiac event monitor to her home.  Patient accepted.  Patient informed she will be enrolled today, [employer] will call to verify shipping address.  She will receive package via [employer].  Follow instructions per enclosed instruction manual, call [employer] directly with questions.  Ship back directly to [employer] after 30 days.  Use same prepaid UPS box the monitor arrived in.  Call [employer] to schedule UPS to pick up at your home.

## 2018-04-15 ENCOUNTER — Telehealth: Payer: Self-pay | Admitting: Cardiovascular Disease

## 2018-04-15 NOTE — Telephone Encounter (Signed)
   Primary Cardiologist:  No primary care provider on file.   Patient contacted.  History reviewed.  I talked to patient today by telephone.  She still having some woozy symptoms.  I suspect that she is volume depleted.  I recommended that she start drinking a V8 juice every morning updated as to her symptoms will improve and may even resolve completely.  She is scheduled to get a monitor which is being mailed out to her.  We will reschedule this echocardiogram for 8 to 12 weeks from now.  She will call us back if she has any additional problems.     No symptoms to suggest any unstable cardiac conditions.  Based on discussion, with current pandemic situation, we will be postponing this appointment for [facility] with a plan for f/u in 12 wks or sooner if feasible/necessary.  If symptoms change, she has been instructed to contact our office.   Routing to C19 CANCEL pool for tracking (P CV DIV CV19 CANCEL - reason for visit "other.") and   assigning priority ( 3 = >12 wks).   Kristeen Miss, MD  04/15/2018 4:25 PM         .

## 2018-04-18 ENCOUNTER — Ambulatory Visit (INDEPENDENT_AMBULATORY_CARE_PROVIDER_SITE_OTHER): Payer: Medicaid Other

## 2018-04-18 ENCOUNTER — Ambulatory Visit (HOSPITAL_COMMUNITY): Payer: Medicaid Other

## 2018-04-18 ENCOUNTER — Encounter: Payer: Self-pay | Admitting: Cardiology

## 2018-04-18 DIAGNOSIS — R55 Syncope and collapse: Secondary | ICD-10-CM

## 2018-04-25 ENCOUNTER — Telehealth: Payer: Self-pay | Admitting: *Deleted

## 2018-04-25 NOTE — Telephone Encounter (Signed)
Monitor strip left at first desk in triage, for tomorrow's staff to address

## 2018-04-25 NOTE — Telephone Encounter (Signed)
Received [employer] monitor report for 4/5 @ 6:01 pm showing:  "Lead loss, pt symptom tired/fatigued, passed out, SOB".  Called pt to discuss/follow up on....lmtcb

## 2018-04-26 NOTE — Telephone Encounter (Signed)
Spoke with patient regarding [employer] strip from 4/5.  She reports she had been outside playing with the kids.  She came inside, felt tired and then must have passed out because the next thing she remembers is her son waking her up.  Afterwards she reports initially feeling tired and "out of it" which gradually got better with rest.  Today she reports feeling fine. She does not recall speaking to anyone on the evening of 4/5 about this event and there is no documentation she was contacted about it.  Monitor strip taken to Dr Eden Emms for review.

## 2018-04-26 NOTE — Telephone Encounter (Signed)
Dr Eden Emms review monitor.  No new orders were given.  Strip placed in Dr Malachy Mood box for review.

## 2018-05-18 ENCOUNTER — Other Ambulatory Visit: Payer: Self-pay | Admitting: *Deleted

## 2018-05-18 ENCOUNTER — Other Ambulatory Visit: Payer: Self-pay

## 2018-05-18 DIAGNOSIS — R55 Syncope and collapse: Secondary | ICD-10-CM

## 2018-05-20 ENCOUNTER — Telehealth: Payer: Self-pay

## 2018-05-20 NOTE — Telephone Encounter (Signed)
   TELEPHONE CALL NOTE  This patient has been deemed a candidate for follow-up tele-health visit to limit community exposure during the Covid-19 pandemic. I spoke with the patient via phone to discuss instructions.   A Virtual Office Visit appointment type has been scheduled for 5/4 with Robbie Lis, PA, with "VIDEO" or "TELEPHONE" in the appointment notes - patient prefers Video type.   Sigurd Sos, RN 05/20/2018 12:56 PM  Patient consented to Video Virtual Visit with Robbie Lis, PA on 5/4 @ 10:30

## 2018-05-23 ENCOUNTER — Encounter: Payer: Self-pay | Admitting: Cardiology

## 2018-05-23 ENCOUNTER — Other Ambulatory Visit: Payer: Self-pay

## 2018-05-23 ENCOUNTER — Telehealth (INDEPENDENT_AMBULATORY_CARE_PROVIDER_SITE_OTHER): Payer: Medicaid Other | Admitting: Cardiology

## 2018-05-23 VITALS — Ht 71.0 in | Wt 201.0 lb

## 2018-05-23 DIAGNOSIS — Z87898 Personal history of other specified conditions: Secondary | ICD-10-CM

## 2018-05-23 NOTE — Patient Instructions (Signed)
Medication Instructions:  none If you need a refill on your cardiac medications before your next appointment, please call your pharmacy.   Lab work: none If you have labs (blood work) drawn today and your tests are completely normal, you will receive your results only by: Marland Kitchen MyChart Message (if you have MyChart) OR . A paper copy in the mail If you have any lab test that is abnormal or we need to change your treatment, we will call you to review the results.  Testing/Procedures: ECHO scheduled none  Follow-Up: To Be Determined At [facility], you and your health needs are our priority.  As part of our continuing mission to provide you with exceptional heart care, we have created designated Provider Care Teams.  These Care Teams include your primary Cardiologist (physician) and Advanced Practice Providers (APPs -  Physician Assistants and Nurse Practitioners) who all work together to provide you with the care you need, when you need it. Any Other Special Instructions Will Be Listed Below (If Applicable).
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
## 2018-05-24 ENCOUNTER — Other Ambulatory Visit: Payer: Self-pay

## 2018-05-24 DIAGNOSIS — R55 Syncope and collapse: Secondary | ICD-10-CM

## 2018-05-25 ENCOUNTER — Telehealth: Payer: Self-pay

## 2018-05-25 NOTE — Telephone Encounter (Signed)
**Note De-Identified  Obfuscation** I have advised the pt of her monitor results per Dr Mayford Knife. She verbalized understanding and thanked me for calling with results.

## 2018-05-27 ENCOUNTER — Telehealth: Payer: Self-pay

## 2018-05-27 NOTE — Telephone Encounter (Signed)
Spoke with pt regarding appt on 05/30/18. Pt stated she will check vitals prior to appt. Pt concerns were address.

## 2018-05-30 ENCOUNTER — Encounter: Payer: Self-pay | Admitting: Internal Medicine

## 2018-05-30 ENCOUNTER — Telehealth (INDEPENDENT_AMBULATORY_CARE_PROVIDER_SITE_OTHER): Payer: Medicaid Other | Admitting: Internal Medicine

## 2018-05-30 DIAGNOSIS — R55 Syncope and collapse: Secondary | ICD-10-CM

## 2018-05-30 DIAGNOSIS — R002 Palpitations: Secondary | ICD-10-CM | POA: Diagnosis not present
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
## 2018-06-03 ENCOUNTER — Telehealth (HOSPITAL_COMMUNITY): Payer: Self-pay | Admitting: Radiology

## 2018-06-03 NOTE — Telephone Encounter (Signed)
Called patient left message for to  Call office able to give instructions but not able to do COVID 19 screening.

## 2018-06-06 ENCOUNTER — Ambulatory Visit (HOSPITAL_COMMUNITY): Payer: Medicaid Other | Attending: Cardiology

## 2018-06-06 ENCOUNTER — Other Ambulatory Visit: Payer: Self-pay

## 2018-06-06 DIAGNOSIS — R55 Syncope and collapse: Secondary | ICD-10-CM

## 2018-06-19 NOTE — Progress Notes (Signed)
Thank you :)

## 2018-06-23 ENCOUNTER — Telehealth: Payer: Self-pay | Admitting: Nurse Practitioner

## 2018-06-23 NOTE — Telephone Encounter (Signed)
Left message for patient to call and schedule ILR implant.   Gypsy Balsam, NP 06/23/2018 9:25 AM

## 2018-06-28 NOTE — Telephone Encounter (Signed)
Call placed to Pt.  Pt agreeable to loop placement 06/29/2018 at 10:30 am.  Also left CVM for mother per DPR advising of procedure.  Pt denies any current s/s of coronavirus.  Pt denies any potential sick contacts.

## 2018-06-29 ENCOUNTER — Ambulatory Visit: Payer: Medicaid Other | Admitting: Internal Medicine

## 2018-07-11 ENCOUNTER — Other Ambulatory Visit: Payer: Self-pay

## 2018-07-11 ENCOUNTER — Ambulatory Visit (INDEPENDENT_AMBULATORY_CARE_PROVIDER_SITE_OTHER): Payer: Medicaid Other | Admitting: Internal Medicine

## 2018-07-11 ENCOUNTER — Encounter: Payer: Self-pay | Admitting: Internal Medicine

## 2018-07-11 VITALS — BP 124/78 | HR 94 | Ht 71.0 in | Wt 217.0 lb

## 2018-07-11 DIAGNOSIS — R55 Syncope and collapse: Secondary | ICD-10-CM

## 2018-07-11 DIAGNOSIS — R002 Palpitations: Secondary | ICD-10-CM | POA: Insufficient documentation
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
## 2018-07-11 NOTE — Patient Instructions (Addendum)
Medication Instructions:  Your physician recommends that you continue on your current medications as directed. Please refer to the Current Medication list given to you today.  Labwork: None ordered.  Testing/Procedures: None ordered.  Follow-Up:  You will have a virtual appointment for your wound check on July 21, 2018 at 10:30 am.  Your physician wants you to follow-up in: 3 months with Angel BalsamAmber Seiler, NP.  You will receive a recall letter when it gets closer to time for your appointment.  Then call the office to schedule.  Remote monitoring monthly with your loop recorder.  Any Other Special Instructions Will Be Listed Below (If Applicable).  If you need a refill on your cardiac medications before your next appointment, please call your pharmacy.    Implantable Loop Recorder Placement, Care After This sheet gives you information about how to care for yourself after your procedure. Your health care provider may also give you more specific instructions. If you have problems or questions, contact your health care provider. What can I expect after the procedure? After the procedure, it is common to have:  Soreness or discomfort near the incision.  Some swelling or bruising near the incision. Follow these instructions at home: Incision care   Follow instructions from your health care provider about how to take care of your incision. Make sure you: ? Wash your hands with soap and water before you change your bandage (dressing). If soap and water are not available, use hand sanitizer. ? Change your dressing as told by your health care provider. ? Keep your dressing dry. ? Leave stitches (sutures), skin glue, or adhesive strips in place. These skin closures may need to stay in place for 2 weeks or longer. If adhesive strip edges start to loosen and curl up, you may trim the loose edges. Do not remove adhesive strips completely unless your health care provider tells you to do  that.  Check your incision area every day for signs of infection. Check for: ? Redness, swelling, or pain. ? Fluid or blood. ? Warmth. ? Pus or a bad smell.  Do not take baths, swim, or use a hot tub until your health care provider approves. Ask your health care provider if you can take showers. Activity   Return to your normal activities as told by your health care provider. Ask your health care provider what activities are safe for you.  Do not drive for 24 hours if you were given a sedative during your procedure. General instructions  Follow instructions from your health care provider about how to manage your implantable loop recorder and transmit the information. Learn how to activate a recording if this is necessary for your type of device.  Do not go through a metal detection gate, and do not let someone hold a metal detector over your chest. Show your ID card.  Do not have an MRI unless you check with your health care provider first.  Take over-the-counter and prescription medicines only as told by your health care provider.  Keep all follow-up visits as told by your health care provider. This is important. Contact a health care provider if:  You have redness, swelling, or pain around your incision.  You have a fever.  You have pain that is not relieved by your pain medicine.  You have triggered your device because of fainting (syncope) or because of a heartbeat that feels like it is racing, slow, fluttering, or skipping (palpitations). Get help right away if you have:  Chest pain.  Difficulty breathing. Summary  After the procedure, it is common to have soreness or discomfort near the incision.  Change your dressing as told by your health care provider.  Follow instructions from your health care provider about how to manage your implantable loop recorder and transmit the information.  Keep all follow-up visits as told by your health care provider. This is  important. This information is not intended to replace advice given to you by your health care provider. Make sure you discuss any questions you have with your health care provider. Document Released: 12/17/2014 Document Revised: 02/20/2017 Document Reviewed: 02/20/2017 Elsevier Interactive Patient Education  2019 [employer].

## 2018-07-20 ENCOUNTER — Telehealth: Payer: Self-pay

## 2018-07-20 NOTE — Telephone Encounter (Signed)
Appointment 07-21-2018 at 10:30 am.      COVID-19 Pre-Screening Questions:  . In the past 7 to 10 days have you had a cough,  shortness of breath, headache, congestion, fever (100 or greater) body aches, chills, sore throat, or sudden loss of taste or sense of smell? No . Have you been around anyone with known Covid 19. No . Have you been around anyone who is awaiting Covid 19 test results in the past 7 to 10 days? No . Have you been around anyone who has been exposed to Covid 19, or has mentioned symptoms of Covid 19 within the past 7 to 10 days? NO  If you have any concerns/questions about symptoms patients report during screening (either on the phone or at threshold). Contact the provider seeing the patient or DOD for further guidance.  If neither are available contact a member of the leadership team.        Pt answered No to all Covid-19 prescreening questions. I let her know to wear a mask and if she can physically come alone to her appointment to please do so. We are limiting the amount of people who is coming into the office. I told her if anything changes between now and her appointment to please call the office. The pt verbalized understanding.

## 2018-07-21 ENCOUNTER — Ambulatory Visit: Payer: Medicaid Other

## 2018-07-21 ENCOUNTER — Telehealth: Payer: Self-pay | Admitting: Internal Medicine

## 2018-07-21 NOTE — Telephone Encounter (Signed)
LMOM to call DC. Pt missed woud check for LINQ today and have not received remote  Transmission since omplant. Need manual remote sent.

## 2018-07-25 NOTE — Telephone Encounter (Signed)
LMOVM for pt to send a manual transmission with her home monitor. I left my direct office number for her to call if she has any questions.

## 2018-07-26 NOTE — Telephone Encounter (Signed)
Reviewed manual transmission. 7 symptom episodes--4 available ECGs suggest SR w/ectopy and ST. Pt reports symptoms of fatigue, dizziness, headaches, and a sensation of her heart beating fast during these episodes. No syncope, but feels she could pass out. She is typically active (walking around the house) during these episodes. Will route to Dr. Rayann Heman for review.  Discussed incision as pt missed wound check. Pt previously removed the Steri-strips, reports the incision is closed and denies drainage, redness, swelling, fever, or chills. Encouraged her to call us for any of these symptoms. Also instructed pt to call us to discuss any patient-activated symptom episodes. She confirms she has our direct number and verbalizes understanding of all instructions.

## 2018-07-26 NOTE — Telephone Encounter (Signed)
I called pt to get her to send a manual transmission with your home monitor. I let her talk to the device tech nurse Raquel Sarna.

## 2018-07-30 NOTE — Telephone Encounter (Signed)
No arrhythmias to explain syncope.  Sinus noted.  She should be encouraged to send manual transmissions only for presyncope or syncope  We will continue to follow.

## 2018-08-08 ENCOUNTER — Telehealth: Payer: Self-pay

## 2018-08-08 NOTE — Telephone Encounter (Signed)
LMOVM for pt to call me back. Pt needs to be educated on when to send a manual transmission. According to Dr. Rayann Heman note she should only send a manual transmission for presyncope or syncope episodes.

## 2018-08-09 NOTE — Telephone Encounter (Signed)
LMOVM

## 2018-08-10 NOTE — Telephone Encounter (Signed)
I let the pt know that per Dr. Rayann Heman note she should only send manual transmissions for syncope and presyncope symptoms. The pt verbalized understanding.

## 2018-08-12 ENCOUNTER — Ambulatory Visit (INDEPENDENT_AMBULATORY_CARE_PROVIDER_SITE_OTHER): Payer: Medicaid Other | Admitting: *Deleted

## 2018-08-12 DIAGNOSIS — R55 Syncope and collapse: Secondary | ICD-10-CM

## 2018-08-14 LAB — CUP PACEART REMOTE DEVICE CHECK
Date Time Interrogation Session: 20200725133549
Implantable Pulse Generator Implant Date: 20200622

## 2018-08-18 NOTE — Progress Notes (Signed)
Carelink Summary Report / Loop Recorder 

## 2018-08-23 ENCOUNTER — Telehealth: Payer: Self-pay

## 2018-08-23 NOTE — Telephone Encounter (Signed)
Spoke with patient to advise of disconnected monitor. 

## 2018-09-08 ENCOUNTER — Ambulatory Visit (HOSPITAL_COMMUNITY)
Admission: EM | Admit: 2018-09-08 | Discharge: 2018-09-08 | Disposition: A | Discharge status: Home / Self Care | Payer: Medicaid Other | Attending: Internal Medicine | Admitting: Internal Medicine

## 2018-09-08 ENCOUNTER — Encounter (HOSPITAL_COMMUNITY): Payer: Self-pay

## 2018-09-08 ENCOUNTER — Other Ambulatory Visit: Payer: Self-pay

## 2018-09-08 DIAGNOSIS — R102 Pelvic and perineal pain: Secondary | ICD-10-CM | POA: Diagnosis present

## 2018-09-08 DIAGNOSIS — R350 Frequency of micturition: Secondary | ICD-10-CM | POA: Diagnosis not present

## 2018-09-08 DIAGNOSIS — Z3202 Encounter for pregnancy test, result negative: Secondary | ICD-10-CM

## 2018-09-08 DIAGNOSIS — R319 Hematuria, unspecified: Secondary | ICD-10-CM

## 2018-09-08 LAB — POCT PREGNANCY, URINE: Preg Test, Ur: NEGATIVE

## 2018-09-08 LAB — POCT URINALYSIS DIP (DEVICE)
Bilirubin Urine: NEGATIVE
Glucose, UA: NEGATIVE mg/dL
Ketones, ur: NEGATIVE mg/dL
Leukocytes,Ua: NEGATIVE
Nitrite: NEGATIVE
Protein, ur: 30 mg/dL — AB
Specific Gravity, Urine: 1.025 (ref 1.005–1.030)
Urobilinogen, UA: 0.2 mg/dL (ref 0.0–1.0)
pH: 7 (ref 5.0–8.0)

## 2018-09-08 MED ORDER — NAPROXEN 500 MG PO TABS: 500.0000 mg | ORAL_TABLET | Freq: Two times a day (BID) | ORAL | 0 refills | Status: DC

## 2018-09-08 NOTE — ED Triage Notes (Signed)
Pt states she has pelvis pain. X 1 month.
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
## 2018-09-09 ENCOUNTER — Telehealth: Payer: Self-pay

## 2018-09-09 LAB — URINE CULTURE

## 2018-09-09 NOTE — Telephone Encounter (Signed)
Left message for patient regarding disconnected monitor.  

## 2018-09-10 LAB — CERVICOVAGINAL ANCILLARY ONLY
Bacterial vaginitis: POSITIVE — AB
Candida vaginitis: NEGATIVE
Chlamydia: NEGATIVE
Neisseria Gonorrhea: NEGATIVE
Trichomonas: POSITIVE — AB

## 2018-09-12 ENCOUNTER — Telehealth (HOSPITAL_COMMUNITY): Payer: Self-pay | Admitting: Emergency Medicine

## 2018-09-12 MED ORDER — METRONIDAZOLE 500 MG PO TABS: 500.0000 mg | ORAL_TABLET | Freq: Two times a day (BID) | ORAL | 0 refills | Status: AC

## 2018-09-12 NOTE — Telephone Encounter (Signed)
Bacterial vaginosis is positive. This was not treated at the urgent care visit.  Flagyl 500 mg BID x 7 days #14 no refills sent to patients pharmacy of choice.    Trichomonas is positive. Rx  for Flagyl 2 grams, once was sent to the pharmacy of record. Pt needs education to refrain from sexual intercourse for 7 days to give the medicine time to work. Sexual partners need to be notified and tested/treated. Condoms may reduce risk of reinfection. Recheck for further evaluation if symptoms are not improving.   Patient contacted and made aware of    results, all questions answered

## 2018-09-14 ENCOUNTER — Ambulatory Visit (INDEPENDENT_AMBULATORY_CARE_PROVIDER_SITE_OTHER): Payer: Medicaid Other | Admitting: *Deleted

## 2018-09-14 DIAGNOSIS — R55 Syncope and collapse: Secondary | ICD-10-CM

## 2018-09-15 LAB — CUP PACEART REMOTE DEVICE CHECK
Date Time Interrogation Session: 20200827134050
Implantable Pulse Generator Implant Date: 20200622

## 2018-09-16 ENCOUNTER — Other Ambulatory Visit: Payer: Self-pay

## 2018-09-16 ENCOUNTER — Encounter (INDEPENDENT_AMBULATORY_CARE_PROVIDER_SITE_OTHER): Payer: Self-pay | Admitting: Primary Care

## 2018-09-16 ENCOUNTER — Telehealth (INDEPENDENT_AMBULATORY_CARE_PROVIDER_SITE_OTHER): Payer: Medicaid Other | Admitting: Primary Care

## 2018-09-16 DIAGNOSIS — Z09 Encounter for follow-up examination after completed treatment for conditions other than malignant neoplasm: Secondary | ICD-10-CM

## 2018-09-16 DIAGNOSIS — D509 Iron deficiency anemia, unspecified: Secondary | ICD-10-CM | POA: Diagnosis not present

## 2018-09-16 DIAGNOSIS — R102 Pelvic and perineal pain: Secondary | ICD-10-CM

## 2018-09-16 DIAGNOSIS — M25559 Pain in unspecified hip: Secondary | ICD-10-CM

## 2018-09-16 MED ORDER — IBUPROFEN 600 MG PO TABS: 600.0000 mg | ORAL_TABLET | Freq: Three times a day (TID) | ORAL | 1 refills | Status: DC | PRN

## 2018-09-16 NOTE — Progress Notes (Signed)
Pelvic pain off and on for the last month and has recently become more constant
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
## 2018-09-22 NOTE — Progress Notes (Signed)
Carelink Summary Report / Loop Recorder 

## 2018-09-29 ENCOUNTER — Ambulatory Visit (HOSPITAL_COMMUNITY)
Admission: EM | Admit: 2018-09-29 | Discharge: 2018-09-29 | Disposition: A | Discharge status: Home / Self Care | Payer: Medicaid Other | Attending: Internal Medicine | Admitting: Internal Medicine

## 2018-09-29 ENCOUNTER — Other Ambulatory Visit: Payer: Self-pay

## 2018-09-29 ENCOUNTER — Encounter (HOSPITAL_COMMUNITY): Payer: Self-pay

## 2018-09-29 DIAGNOSIS — Z9889 Other specified postprocedural states: Secondary | ICD-10-CM | POA: Insufficient documentation

## 2018-09-29 DIAGNOSIS — Z20828 Contact with and (suspected) exposure to other viral communicable diseases: Secondary | ICD-10-CM | POA: Insufficient documentation

## 2018-09-29 DIAGNOSIS — R0602 Shortness of breath: Secondary | ICD-10-CM | POA: Diagnosis present

## 2018-09-29 DIAGNOSIS — R002 Palpitations: Secondary | ICD-10-CM | POA: Insufficient documentation

## 2018-09-29 DIAGNOSIS — Z791 Long term (current) use of non-steroidal anti-inflammatories (NSAID): Secondary | ICD-10-CM | POA: Insufficient documentation

## 2018-09-29 LAB — POCT URINALYSIS DIP (DEVICE)
Bilirubin Urine: NEGATIVE
Glucose, UA: NEGATIVE mg/dL
Ketones, ur: NEGATIVE mg/dL
Leukocytes,Ua: NEGATIVE
Nitrite: NEGATIVE
Protein, ur: NEGATIVE mg/dL
Specific Gravity, Urine: >=1.03 (ref 1.005–1.030)
Urobilinogen, UA: 1 mg/dL (ref 0.0–1.0)
pH: 5.5 (ref 5.0–8.0)

## 2018-09-29 LAB — TSH: TSH: 3.286 u[IU]/mL (ref 0.350–4.500)

## 2018-09-29 NOTE — ED Triage Notes (Signed)
Patient presents to Urgent Care with complaints of SOb since yesterday while laying flat. Patient reports her mom is a Marine scientist and thinks she has fluid on her lungs. Pt states she has no symptoms unless she lays down flat. No sick contacts.
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
## 2018-10-01 LAB — VITAMIN D 25 HYDROXY (VIT D DEFICIENCY, FRACTURES): Vit D, 25-Hydroxy: 21.7 ng/mL — ABNORMAL LOW (ref 30.0–100.0)

## 2018-10-02 LAB — NOVEL CORONAVIRUS, NAA (HOSP ORDER, SEND-OUT TO REF LAB; TAT 18-24 HRS): SARS-CoV-2, NAA: NOT DETECTED

## 2018-10-03 ENCOUNTER — Telehealth (HOSPITAL_COMMUNITY): Payer: Self-pay | Admitting: Emergency Medicine

## 2018-10-03 ENCOUNTER — Telehealth: Payer: Self-pay | Admitting: Internal Medicine

## 2018-10-03 NOTE — Telephone Encounter (Signed)
Pt is calling to speak with Dr. Jackalyn Lombard RN, as she was instructed to when she last went to the hospital on 9/10.  Pt was calling to get a visit with Dr. Rayann Heman scheduled in the very near future, for ongoing sob on exertion, palpitations, and feeling dizzy, with pre-syncopal episodes.  Pt went to the hospital for this on 9/10, at [facility]. Pt states she has not had any syncopal episodes, but over the past week or 2, she has had issues with ongoing sob on exertion, palpitations, feeling dizzy, and feeling pre-syncopal.  Pt does have an ILR and her next check is 9/28 with our office.  Pt states she is asymptomatic today, but would like a call back from Dr. Jackalyn Lombard RN, to get an appt with him in the office, before 9/28.  Pt states she has no lower extremity edema and no weight gain. Pt states her current weight is 216 lbs.  Pt has no chest pain, N/V, diaphoresis, or syncopal episodes when her symptoms occur. Informed the pt that I will route this message to Dr. Jackalyn Lombard nurse Sonia Baller, to call her back and further assist with getting her an appt with him before her remote check on 9/28.  Advised the pt that if her symptoms return or worsen between now and when Dr. Jackalyn Lombard RN calls her back, she should notify the office immediately or call 911.  Pt verbalized understanding and agrees with this plan.

## 2018-10-03 NOTE — Telephone Encounter (Signed)
I spoke with the pt and requested the manual transmission. She states she is not at home but will do so when she get home. I told her when the nurse see the transmission she will give her a call back. The pt had questions about medication and how long she supposed to wear the heart monitor. I told her when they schedule the appointment with the nurse practitioner then the nurse practitioner will be able to answer those questions.

## 2018-10-03 NOTE — Telephone Encounter (Signed)
Results reviewed with Dr. Meda Coffee. Per Dr. Meda Coffee, pt to take 2000 iu Vit D3 OTC. Patient contacted and made aware of    results, all questions answered

## 2018-10-03 NOTE — Telephone Encounter (Signed)
Pt c/o Shortness Of Breath: STAT if SOB developed within the last 24 hours or pt is noticeably SOB on the phone  1. Are you currently SOB (can you hear that pt is SOB on the phone)?  no  2. How long have you been experiencing SOB?  4 months- getting worse  3. Are you SOB when sitting or when up moving around? lying down it worse and sometimes when she exert herself she become short of breath  4. Are you currently experiencing any other symptoms?  no

## 2018-10-04 NOTE — Telephone Encounter (Signed)
Transmission reviewed. Presenting rhythm appears ST at ~100bpm. 2 "symptom" episodes--ECGs show SR (on 9/10 at 10:45) and ST (on 9/11 at 21:17). No tachy, pause, brady, or AF episodes.

## 2018-10-04 NOTE — Telephone Encounter (Signed)
Transmission received.

## 2018-10-04 NOTE — Telephone Encounter (Signed)
Returned call to Pt.  Advised appt made for her next week with Jonni Sanger.  Notified nothing found on her loop recorder recently that would cause syncope.  Advised to keep using symptom activator.  Pt wanted to know how long she would wear monitor.  Advised battery life for loop is 3 years.  No further questions.
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
## 2018-10-12 ENCOUNTER — Other Ambulatory Visit: Payer: Self-pay

## 2018-10-12 ENCOUNTER — Ambulatory Visit: Payer: Medicaid Other | Admitting: Student

## 2018-10-12 VITALS — BP 128/74 | HR 67 | Ht 71.0 in | Wt 221.0 lb

## 2018-10-12 DIAGNOSIS — R55 Syncope and collapse: Secondary | ICD-10-CM | POA: Diagnosis not present

## 2018-10-12 DIAGNOSIS — R002 Palpitations: Secondary | ICD-10-CM

## 2018-10-12 DIAGNOSIS — R0600 Dyspnea, unspecified: Secondary | ICD-10-CM | POA: Diagnosis not present

## 2018-10-12 LAB — CUP PACEART INCLINIC DEVICE CHECK
Date Time Interrogation Session: 20200923134604
Implantable Pulse Generator Implant Date: 20200622

## 2018-10-13 ENCOUNTER — Telehealth (INDEPENDENT_AMBULATORY_CARE_PROVIDER_SITE_OTHER): Payer: Medicaid Other | Admitting: Primary Care

## 2018-10-13 ENCOUNTER — Encounter (INDEPENDENT_AMBULATORY_CARE_PROVIDER_SITE_OTHER): Payer: Self-pay | Admitting: Primary Care

## 2018-10-13 DIAGNOSIS — Z3009 Encounter for other general counseling and advice on contraception: Secondary | ICD-10-CM | POA: Diagnosis not present
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
## 2018-10-17 ENCOUNTER — Encounter (HOSPITAL_COMMUNITY): Payer: Self-pay

## 2018-10-17 ENCOUNTER — Other Ambulatory Visit: Payer: Self-pay

## 2018-10-17 ENCOUNTER — Ambulatory Visit (INDEPENDENT_AMBULATORY_CARE_PROVIDER_SITE_OTHER): Payer: Medicaid Other | Admitting: *Deleted

## 2018-10-17 ENCOUNTER — Emergency Department (HOSPITAL_COMMUNITY)
Admission: EM | Admit: 2018-10-17 | Discharge: 2018-10-17 | Disposition: A | Discharge status: Home / Self Care | Payer: Medicaid Other | Attending: Emergency Medicine | Admitting: Emergency Medicine

## 2018-10-17 ENCOUNTER — Emergency Department (HOSPITAL_COMMUNITY): Payer: Medicaid Other

## 2018-10-17 DIAGNOSIS — R55 Syncope and collapse: Secondary | ICD-10-CM | POA: Insufficient documentation

## 2018-10-17 DIAGNOSIS — R295 Transient paralysis: Secondary | ICD-10-CM | POA: Insufficient documentation

## 2018-10-17 DIAGNOSIS — R002 Palpitations: Secondary | ICD-10-CM

## 2018-10-17 LAB — CBC
HCT: 39.5 % (ref 36.0–46.0)
Hemoglobin: 13.4 g/dL (ref 12.0–15.0)
MCH: 30.8 pg (ref 26.0–34.0)
MCHC: 33.9 g/dL (ref 30.0–36.0)
MCV: 90.8 fL (ref 80.0–100.0)
Platelets: 277 10*3/uL (ref 150–400)
RBC: 4.35 MIL/uL (ref 3.87–5.11)
RDW: 12.4 % (ref 11.5–15.5)
WBC: 4.9 10*3/uL (ref 4.0–10.5)
nRBC: 0 % (ref 0.0–0.2)

## 2018-10-17 LAB — BASIC METABOLIC PANEL
Anion gap: 7 (ref 5–15)
BUN: 6 mg/dL (ref 6–20)
CO2: 28 mmol/L (ref 22–32)
Calcium: 9.4 mg/dL (ref 8.9–10.3)
Chloride: 103 mmol/L (ref 98–111)
Creatinine, Ser: 0.78 mg/dL (ref 0.44–1.00)
GFR calc Af Amer: >60 mL/min (ref >60)
GFR calc non Af Amer: >60 mL/min (ref >60)
Glucose, Bld: 89 mg/dL (ref 70–99)
Potassium: 4 mmol/L (ref 3.5–5.1)
Sodium: 138 mmol/L (ref 135–145)

## 2018-10-17 LAB — URINALYSIS, ROUTINE W REFLEX MICROSCOPIC
Bilirubin Urine: NEGATIVE
Glucose, UA: NEGATIVE mg/dL
Ketones, ur: NEGATIVE mg/dL
Nitrite: NEGATIVE
Protein, ur: NEGATIVE mg/dL
Specific Gravity, Urine: 1.006 (ref 1.005–1.030)
pH: 7 (ref 5.0–8.0)

## 2018-10-17 LAB — I-STAT BETA HCG BLOOD, ED (MC, WL, AP ONLY): I-stat hCG, quantitative: <5 m[IU]/mL (ref <5)

## 2018-10-17 MED ORDER — SODIUM CHLORIDE 0.9 % IV BOLUS
1000.0000 mL | Freq: Once | INTRAVENOUS | Status: AC
  Administered 2018-10-17: 13:00:00 1000 mL via INTRAVENOUS

## 2018-10-17 MED ORDER — GADOBUTROL 1 MMOL/ML IV SOLN
10.0000 mL | Freq: Once | INTRAVENOUS | Status: AC | PRN
  Administered 2018-10-17: 10 mL via INTRAVENOUS

## 2018-10-17 NOTE — ED Triage Notes (Signed)
Patient states that she passed out while laying in bed this am. Reporting details of event and states she couldn't speak. On arrival alert and oriented, no deficits. States she has regular passing out spells

## 2018-10-17 NOTE — ED Notes (Signed)
Patient transported to MRI 
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
## 2018-10-17 NOTE — ED Notes (Signed)
Pt up standing up in the room talking on the phone, when entering the room, pt sits down in the bed abruptly. She states "I have syncope and paralyzed everywhere, it happens all the time when I eat, sleep, and during sex.Marland KitchenMarland KitchenI cant have sex that's crazy."

## 2018-10-17 NOTE — ED Notes (Signed)
Pt crumbles up discharge paperwork and states "Nothing is wrong with me, I don't need it." RN advised the patient to keep paperwork. She proceeds to place it in the garbage and walks out.EDP notified.

## 2018-10-17 NOTE — Discharge Instructions (Addendum)
You are seen in the emergency department today for passing out.  Your MRI did not show any abnormalities.  Your labs did not show any substantial abnormalities compared to prior labs.  Given your recurrent passing out episodes we would like you to follow-up very closely with neurology for possible further testing.  We have sent a referral and we have also given you their phone number your discharge instructions, we would like you to follow-up within 1 week.  Do not drive or operate heavy machinery until cleared by neurology.  Please be sure to stay well-hydrated.  Return to the ER for new or worsening symptoms or any other concerns.

## 2018-10-18 LAB — CUP PACEART REMOTE DEVICE CHECK
Date Time Interrogation Session: 20200929194548
Implantable Pulse Generator Implant Date: 20200622

## 2018-10-26 NOTE — Progress Notes (Signed)
Carelink Summary Report / Loop Recorder 

## 2018-10-27 ENCOUNTER — Encounter (INDEPENDENT_AMBULATORY_CARE_PROVIDER_SITE_OTHER): Payer: Self-pay | Admitting: Primary Care

## 2018-10-27 ENCOUNTER — Ambulatory Visit (INDEPENDENT_AMBULATORY_CARE_PROVIDER_SITE_OTHER): Payer: Medicaid Other | Admitting: Primary Care

## 2018-10-27 ENCOUNTER — Other Ambulatory Visit: Payer: Self-pay

## 2018-10-27 VITALS — BP 132/82 | HR 104 | Temp 97.6°F | Ht 71.0 in | Wt 217.0 lb

## 2018-10-27 DIAGNOSIS — Z30011 Encounter for initial prescription of contraceptive pills: Secondary | ICD-10-CM | POA: Diagnosis not present

## 2018-10-27 DIAGNOSIS — R030 Elevated blood-pressure reading, without diagnosis of hypertension: Secondary | ICD-10-CM | POA: Diagnosis not present

## 2018-10-27 DIAGNOSIS — R55 Syncope and collapse: Secondary | ICD-10-CM | POA: Diagnosis not present

## 2018-10-27 DIAGNOSIS — Z09 Encounter for follow-up examination after completed treatment for conditions other than malignant neoplasm: Secondary | ICD-10-CM

## 2018-10-27 DIAGNOSIS — Z3202 Encounter for pregnancy test, result negative: Secondary | ICD-10-CM

## 2018-10-27 LAB — POCT URINE PREGNANCY: Preg Test, Ur: NEGATIVE

## 2018-10-27 MED ORDER — NORETHIN ACE-ETH ESTRAD-FE 1-20 MG-MCG PO TABS: 1.0000 | ORAL_TABLET | Freq: Every day | ORAL | 11 refills | Status: DC
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
## 2018-10-27 NOTE — Patient Instructions (Signed)

## 2018-11-02 ENCOUNTER — Telehealth: Payer: Self-pay

## 2018-11-02 ENCOUNTER — Other Ambulatory Visit: Payer: Self-pay

## 2018-11-02 ENCOUNTER — Ambulatory Visit: Payer: Medicaid Other | Admitting: Neurology

## 2018-11-02 NOTE — Telephone Encounter (Signed)
Pt did not show for their appt with Dr. Athar today.  

## 2018-11-03 ENCOUNTER — Encounter: Payer: Self-pay | Admitting: Neurology

## 2018-11-21 ENCOUNTER — Encounter: Payer: Medicaid Other | Admitting: *Deleted

## 2018-12-12 ENCOUNTER — Ambulatory Visit: Payer: Medicaid Other | Admitting: Neurology

## 2018-12-12 ENCOUNTER — Encounter: Payer: Self-pay | Admitting: Neurology

## 2018-12-12 ENCOUNTER — Other Ambulatory Visit: Payer: Self-pay

## 2018-12-12 VITALS — BP 148/95 | HR 92 | Ht 71.0 in | Wt 221.0 lb

## 2018-12-12 DIAGNOSIS — R404 Transient alteration of awareness: Secondary | ICD-10-CM

## 2018-12-12 DIAGNOSIS — G4719 Other hypersomnia: Secondary | ICD-10-CM

## 2018-12-12 DIAGNOSIS — E669 Obesity, unspecified: Secondary | ICD-10-CM

## 2018-12-12 DIAGNOSIS — R55 Syncope and collapse: Secondary | ICD-10-CM

## 2018-12-12 DIAGNOSIS — G478 Other sleep disorders: Secondary | ICD-10-CM

## 2018-12-12 DIAGNOSIS — Z5181 Encounter for therapeutic drug level monitoring: Secondary | ICD-10-CM

## 2018-12-12 DIAGNOSIS — G479 Sleep disorder, unspecified: Secondary | ICD-10-CM

## 2018-12-12 NOTE — Patient Instructions (Signed)
Your neurological exam is normal.  I do not know how to explain your passing out spells.  Vasovagal syncope is one of the most common causes of fainting. Vasovagal syncope occurs when your body overreacts to certain triggers, such as the sight of blood or extreme emotional distress or overheating.  The vasovagal syncope trigger causes a sudden drop in your heart rate and blood pressure, which leads to reduced blood flow to your brain, which results in a brief loss of consciousness. Vasovagal syncope is usually harmless and requires no treatment. However, if you collapse and fall, it is possible you may injure yourself.  Pre-sycopal symptoms include (but are not limited to): Skin paleness, lightheadedness, tunnel vision, nausea, a rising feeling of warmth, feeling cold and clammy, excess yawning, and blurry vision. You may have jerky, abnormal movements, a slow and weak pulse and dilated pupils and you may have some confusion and mental slowing when you come to.  Common triggers for vasovagal syncope include (but are not limited to): Standing for long periods of time, heat exposure, the sight of blood or having blood drawn, fear and straining, such as during a bowel movement.  There is no specific medication for treatment of fainting. Sometimes we use medications to keep the blood pressure elevated but this is rare. Supportive treatment includes foot exercises, wearing compression stockings or tensing your leg muscles when standing, increasing salt in your diet (unless you have high blood pressure). Avoid prolonged standing - especially in hot, crowded places - and drink plenty of fluids and change position from sitting to standing or lying to standing slowly and avoid overheating.    Nevertheless, from the neurological standpoint, I suggest further work-up in the form of an EEG (brainwave test), which we will schedule. We will call you with the results.   We will also Look at your symptoms from the  standpoint of a possible underlying sleep disorder such as narcolepsy. I would like to proceed with nighttime sleep study, followed by a daytime nap study.   We will call to schedule you soon.
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
## 2018-12-23 ENCOUNTER — Ambulatory Visit (INDEPENDENT_AMBULATORY_CARE_PROVIDER_SITE_OTHER): Payer: Medicaid Other | Admitting: *Deleted

## 2018-12-23 DIAGNOSIS — R002 Palpitations: Secondary | ICD-10-CM

## 2018-12-24 LAB — CUP PACEART REMOTE DEVICE CHECK
Date Time Interrogation Session: 20201204144604
Implantable Pulse Generator Implant Date: 20200622

## 2019-01-02 ENCOUNTER — Ambulatory Visit: Payer: Medicaid Other

## 2019-01-09 ENCOUNTER — Ambulatory Visit (INDEPENDENT_AMBULATORY_CARE_PROVIDER_SITE_OTHER): Payer: Medicaid Other | Admitting: Neurology

## 2019-01-09 DIAGNOSIS — G4719 Other hypersomnia: Secondary | ICD-10-CM

## 2019-01-09 DIAGNOSIS — G479 Sleep disorder, unspecified: Secondary | ICD-10-CM

## 2019-01-09 DIAGNOSIS — R404 Transient alteration of awareness: Secondary | ICD-10-CM

## 2019-01-09 DIAGNOSIS — E669 Obesity, unspecified: Secondary | ICD-10-CM

## 2019-01-09 DIAGNOSIS — G4710 Hypersomnia, unspecified: Secondary | ICD-10-CM | POA: Diagnosis not present

## 2019-01-09 DIAGNOSIS — R55 Syncope and collapse: Secondary | ICD-10-CM

## 2019-01-09 DIAGNOSIS — G4720 Circadian rhythm sleep disorder, unspecified type: Secondary | ICD-10-CM

## 2019-01-09 DIAGNOSIS — R0683 Snoring: Secondary | ICD-10-CM

## 2019-01-09 DIAGNOSIS — G478 Other sleep disorders: Secondary | ICD-10-CM

## 2019-01-10 ENCOUNTER — Ambulatory Visit (INDEPENDENT_AMBULATORY_CARE_PROVIDER_SITE_OTHER): Payer: Medicaid Other | Admitting: Neurology

## 2019-01-10 ENCOUNTER — Other Ambulatory Visit: Payer: Self-pay | Admitting: Neurology

## 2019-01-10 DIAGNOSIS — G4753 Recurrent isolated sleep paralysis: Secondary | ICD-10-CM | POA: Diagnosis not present

## 2019-01-10 DIAGNOSIS — G478 Other sleep disorders: Secondary | ICD-10-CM

## 2019-01-10 DIAGNOSIS — Z5181 Encounter for therapeutic drug level monitoring: Secondary | ICD-10-CM

## 2019-01-10 DIAGNOSIS — R55 Syncope and collapse: Secondary | ICD-10-CM

## 2019-01-10 DIAGNOSIS — G479 Sleep disorder, unspecified: Secondary | ICD-10-CM

## 2019-01-10 DIAGNOSIS — R404 Transient alteration of awareness: Secondary | ICD-10-CM

## 2019-01-10 DIAGNOSIS — G4719 Other hypersomnia: Secondary | ICD-10-CM

## 2019-01-10 DIAGNOSIS — E669 Obesity, unspecified: Secondary | ICD-10-CM

## 2019-01-10 NOTE — Addendum Note (Signed)
Addended by: Inis Sizer D on: 01/10/2019 05:03 PM   Modules accepted: Orders

## 2019-01-10 NOTE — Addendum Note (Signed)
Addended by: Inis Sizer D on: 01/10/2019 05:05 PM   Modules accepted: Orders

## 2019-01-10 NOTE — Progress Notes (Signed)
omp

## 2019-01-10 NOTE — Addendum Note (Signed)
Addended by: Darleen Crocker on: 01/10/2019 05:06 PM   Modules accepted: Orders

## 2019-01-13 LAB — COMPREHENSIVE DRUG ANALYSIS,UR

## 2019-01-16 ENCOUNTER — Encounter: Payer: Self-pay | Admitting: Neurology

## 2019-01-16 NOTE — Progress Notes (Signed)
UDS from day of sleep study was benign. Please update patient.

## 2019-01-26 ENCOUNTER — Telehealth: Payer: Self-pay | Admitting: Neurology

## 2019-01-26 NOTE — Telephone Encounter (Signed)
-----   Message from Huston Foley, MD sent at 01/26/2019  8:03 AM EST ----- Patient referred by hospital for rec. Syncope, she had PSG/MSLT on 12/21 and 12/22, respectively with benign findings: no OSA, no significant leg movements, no significant desaturations and next day nap test showed no significant daytime sleepiness. Please update pt.  I had ordered an EEG, it does not look like it is scheduled, please assist in scheduling.  Thanks,  Huston Foley, MD, PhD [facility])

## 2019-01-26 NOTE — Telephone Encounter (Signed)
Called the patient and reviewed her overnight sleep study and daytime MSLT. Informed her that there was no significant sleep apnea present or concerns and that allowed for the daytime study to be completed. The MSLT study was confirmed narcolepsy is NOT present in this patient and showed no significant daytime sleepiness. Advised the patient that Dr Frances Furbish wanted the patient to complete a EEG. I was able to get the patient scheduled for next wed 02/01/19 at 3:15 with a check in of 2:45 pm. Pt verbalized understanding. Pt had no questions at this time but was encouraged to call back if questions arise.

## 2019-01-26 NOTE — Progress Notes (Signed)
Patient referred by hospital for rec. Syncope, she had PSG/MSLT on 12/21 and 12/22, respectively with benign findings: no OSA, no significant leg movements, no significant desaturations and next day nap test showed no significant daytime sleepiness. Please update pt.  I had ordered an EEG, it does not look like it is scheduled, please assist in scheduling.  Thanks,  Huston Foley, MD, PhD [facility])

## 2019-01-26 NOTE — Procedures (Signed)
PATIENT'S NAME:  Angel Hull, [place] DOB:      Jan 28, 1995      MR#:    [id number]     DATE OF RECORDING: 01/09/2019 REFERRING M.D.:  Gwinda Passe NP Study Performed:   Baseline Polysomnogram HISTORY: 24 year old woman with a history of palpitations, anemia and borderline obesity, who reports recurrent syncopal spells for the past several years and daytime sleepiness. The patient endorsed the Epworth Sleepiness Scale at 11 points. The patient's weight 221 pounds with a height of 71 (inches), resulting in a BMI of 30.9 kg/m2. The patient's neck circumference measured 14.25 inches.  CURRENT MEDICATIONS: Advil, Loestrin   PROCEDURE:  This is a multichannel digital polysomnogram utilizing the Somnostar 11.2 system.  Electrodes and sensors were applied and monitored per AASM Specifications.   EEG, EOG, Chin and Limb EMG, were sampled at 200 Hz.  ECG, Snore and Nasal Pressure, Thermal Airflow, Respiratory Effort, CPAP Flow and Pressure, Oximetry was sampled at 50 Hz. Digital video and audio were recorded.      BASELINE STUDY  Lights Out was at 20:46 and Lights On at 06:41.  Total recording time (TRT) was 595 minutes, with a total sleep time (TST) of 391 minutes.   The patient's sleep latency was 136.5 minutes, which is delayed. REM latency was 89 minutes, which is normal. The sleep efficiency was 65.7%, which is reduced.     SLEEP ARCHITECTURE: WASO (Wake after sleep onset) was 68 minutes with mild to moderate sleep fragmentation noted. There were 37.5 minutes in Stage N1, 163 minutes Stage N2, 110.5 minutes Stage N3 and 80 minutes in Stage REM.  The percentage of Stage N1 was 9.6%, which is increased, Stage N2 was 41.7%, Stage N3 was 28.3% and Stage R (REM sleep) was 20.5%, which is normal. The arousals were noted as: 50 were spontaneous, 0 were associated with PLMs, 0 were associated with respiratory events.  RESPIRATORY ANALYSIS:  There were a total of 0 respiratory events:  0 obstructive apneas, 0  central apneas and 0 mixed apneas with a total of 0 apneas and an apnea index (AI) of 0 /hour. There were 0 hypopneas with a hypopnea index of 0 /hour. The patient also had 0 respiratory event related arousals (RERAs).      The total APNEA/HYPOPNEA INDEX (AHI) was 0/hour and the total RESPIRATORY DISTURBANCE INDEX was  0 /hour.  0 events occurred in REM sleep and 0 events in NREM. The REM AHI was  0 /hour, versus a non-REM AHI of 0. The patient spent 159.5 minutes of total sleep time in the supine position and 232 minutes in non-supine.. The supine AHI was 0.0 versus a non-supine AHI of 0.0.  OXYGEN SATURATION & C02:  The Wake baseline 02 saturation was 98%, with the lowest being 93%. Time spent below 89% saturation equaled 0 minutes.  PERIODIC LIMB MOVEMENTS: The patient had a total of 0 Periodic Limb Movements.  The Periodic Limb Movement (PLM) index was 0 and the PLM Arousal index was 0/hour. Audio and video analysis did not show any abnormal or unusual movements, behaviors, phonations or vocalizations. The patient took no bathroom breaks. Mild snoring was noted. The EKG was in keeping with normal sinus rhythm (NSR).  Post-study, the patient indicated that sleep was the same as usual.   The patient had a nap study, MSLT, next day on 01/10/19 with a mean sleep latency of 18.3 minutes for 5 naps and 0 SOREMPs (sleep onset REM periods) noted. This indicates no significant  daytime sleepiness.    IMPRESSION:  1. Primary Snoring 2. Dysfunctions associated with sleep stages or arousal from sleep  RECOMMENDATIONS:  1. The overnight sleep study does not demonstrate any significant obstructive or central sleep disordered breathing, with the exception of mild snoring, and no significant PLMs.  2. The nocturnal PSG and next day MSLT indicate no significant daytime somnolence. 3. This night time sleep study shows sleep fragmentation and mildly abnormal sleep stage percentages; these are nonspecific  findings and per se do not signify an intrinsic sleep disorder or a cause for the patient's sleep-related symptoms. Causes include (but are not limited to) the first night effect of the sleep study, circadian rhythm disturbances, medication effect or an underlying mood disorder or medical problem.  4. The patient should be cautioned not to drive, work at heights, or operate dangerous or heavy equipment when tired or sleepy. Review and reiteration of good sleep hygiene measures should be pursued with any patient. 5. The patient will be notified of the test results.   I certify that I have reviewed the entire raw data recording prior to the issuance of this report in accordance with the Standards of Accreditation of the American Academy of Sleep Medicine (AASM)   Star Age, MD, PhD Diplomat, American Board of Neurology and Sleep Medicine (Neurology and Sleep Medicine)

## 2019-01-26 NOTE — Procedures (Signed)
Name:  Angel Hull, Angel Hull Reference [id number]  Study Date: 01/10/2019 Procedure #: [id number]  DOB: [birth year]    HISTORY: 24 year old woman with a history of palpitations, anemia and borderline obesity, who reports recurrent syncopal spells for the past several years and daytime sleepiness. The patient endorsed the Epworth Sleepiness Scale at 11 points. The patient's weight 221 pounds with a height of 71 (inches), resulting in a BMI of 30.9 kg/m2. The patient's neck circumference measured 14.25 inches.  Protocol  This is a 13 channel Multiple Sleep Latency Test comprised of 5 channels of EEG (T3-Cz, Cz-T4, F4-M1, C4-M1, O2-M1), 3 channels of Chin EMG, 4 channels of EOG and 1 channel for ECG.   All channels were sampled at 256hz .    This polysomnographic procedure is designed to evaluate (1) the complaint of excessive daytime sleepiness by quantifying the time required to fall asleep and (2) the possibility of narcolepsy by checking for abnormally short latencies to REM sleep.  Electrographic variables include EEG, EMG, EOG and ECG.  Patients are monitored throughout four or five 20-minute opportunities to sleep (naps) at two-hour intervals.  For each nap, the patient is allowed 20 minutes to fall asleep.  Once asleep, the patient is awakened after 15 minutes.  Between naps, the patient is kept as alert as possible.  A sleep latency of 20 minutes indicates that no sleep occurred.  Parametric Analysis  Total Number of Naps 5     NAP # Time of Nap  Sleep Latency (mins) REM Latency (mins) Sleep Time Percent Awake Time Percent  1 08:06 18 0 51 49   2 09:59 13 0 62  38   3 11:53 20.5 0 51  49   4 13:49 20 0 0  100   5 15:50 20 0 0  100    MSLT Summary of Naps  Sleepiness Index: 8.5  Mean Sleep Latency to all Five Naps: 18.3  Mean Sleep Latency to First Four Naps: 17.9  Mean Sleep Latency to First Three Naps: 17.2  Mean Sleep Latency to First Two Naps: 15.5  Number of Naps with REM Sleep:  0    Results from Preceding PSG Study  Sleep Onset Time 23:01 Sleep Efficiency (%) 65.7%  Rise Time 06:40 Sleep Latency (min) 135.5 min  Total Sleep Time  6.5 H REM Latency (min) 89 min    I attest to having reviewed every epoch of the entire raw data recording prior to the issuance of this report in accordance with the Standards of the American Academy of Sleep Medicine.    Interpretation:  1. This multiple sleep latency test reveals a mean sleep latency of 18.3 minutes with 0 sleep periods during which REM sleep was recorded.  A total of 5 sleep periods were recorded.  This study does not indicate significant daytime somnolence and is not supportive of an underlying intrinsic hypersomnolence disorder.  2. This study was preceded by an overnight polysomnogram with a total sleep time (TST) of 391 minutes.       IMPRESSION:  1. Primary Snoring 2. Dysfunctions associated with sleep stages or arousal from sleep  RECOMMENDATIONS:  1. The overnight sleep study does not demonstrate any significant obstructive or central sleep disordered breathing, with the exception of mild snoring, and no significant PLMs.  2. The nocturnal PSG and next day MSLT indicate no significant daytime somnolence. 3. This night time sleep study shows sleep fragmentation and mildly abnormal sleep stage percentages; these are nonspecific findings and per se  do not signify an intrinsic sleep disorder or a cause for the patient's sleep-related symptoms. Causes include (but are not limited to) the first night effect of the sleep study, circadian rhythm disturbances, medication effect or an underlying mood disorder or medical problem.  4. The patient should be cautioned not to drive, work at heights, or operate dangerous or heavy equipment when tired or sleepy. Review and reiteration of good sleep hygiene measures should be pursued with any patient. 5. The patient will be notified of the test results.   I certify that I  have reviewed the entire raw data recording prior to the issuance of this report in accordance with the Standards of Accreditation of the American Academy of Sleep Medicine (AASM)   Huston Foley, MD, PhD Diplomat, American Board of Neurology and Sleep Medicine (Neurology and Sleep Medicine)

## 2019-02-01 ENCOUNTER — Other Ambulatory Visit: Payer: Self-pay

## 2019-02-01 ENCOUNTER — Ambulatory Visit: Payer: Medicaid Other | Admitting: Diagnostic Neuroimaging

## 2019-02-01 DIAGNOSIS — R404 Transient alteration of awareness: Secondary | ICD-10-CM

## 2019-02-01 DIAGNOSIS — R55 Syncope and collapse: Secondary | ICD-10-CM

## 2019-02-01 DIAGNOSIS — G479 Sleep disorder, unspecified: Secondary | ICD-10-CM

## 2019-02-01 DIAGNOSIS — G4719 Other hypersomnia: Secondary | ICD-10-CM

## 2019-02-01 DIAGNOSIS — G478 Other sleep disorders: Secondary | ICD-10-CM

## 2019-02-01 DIAGNOSIS — E669 Obesity, unspecified: Secondary | ICD-10-CM

## 2019-02-06 NOTE — Procedures (Signed)
   [facility]  EEG (ELECTROENCEPHALOGRAM) REPORT   STUDY DATE: 02/01/19 PATIENT NAME: Angel Hull DOB: Jul 27, 1995 MRN: [id number]  ORDERING CLINICIAN: Huston Foley, MD PhD   TECHNOLOGIST: Charlett Blake TECHNIQUE: Electroencephalogram was recorded utilizing standard 10-20 system of lead placement and reformatted into average and bipolar montages.  RECORDING TIME: 27 minutes ACTIVATION: hyperventilation and photic stimulation  CLINICAL INFORMATION: 24 year old female with recurrent syncope.  FINDINGS: Posterior dominant background rhythms, which attenuate with eye opening, ranging 9-10 hertz and 20-30 microvolts. No focal, lateralizing, epileptiform activity or seizures are seen. Patient recorded in the awake and drowsy state. EKG channel shows regular rhythm of 80-85 beats per minute.   IMPRESSION:   Normal EEG in the awake and drowsy states.    INTERPRETING PHYSICIAN:  Suanne Marker, MD Certified in Neurology, Neurophysiology and Neuroimaging  [facility] [address] [place], [place] [address] [phone]

## 2019-02-07 ENCOUNTER — Telehealth: Payer: Self-pay

## 2019-02-07 NOTE — Telephone Encounter (Signed)
I reached out to the pt and advised of results via a vm on her mobile # ( ok per dpr). Pt was advised to call back if she had any questions.

## 2019-02-07 NOTE — Progress Notes (Signed)
Please call and advise the patient that the EEG or brain wave test we performed was reported as normal in the awake and drowsy states. We checked for abnormal electrical discharges in the brain waves and the report suggested normal findings. No further action is required on this test at this time.  At this juncture, she can FU with her Primary Care.

## 2019-02-07 NOTE — Telephone Encounter (Signed)
-----   Message from Huston Foley, MD sent at 02/07/2019  8:11 AM EST ----- Please call and advise the patient that the EEG or brain wave test we performed was reported as normal in the awake and drowsy states. We checked for abnormal electrical discharges in the brain waves and the report suggested normal findings. No further action is required on this test at this time.  At this juncture, she can FU with her Primary Care.

## 2019-04-14 ENCOUNTER — Ambulatory Visit: Payer: Medicaid Other | Attending: Internal Medicine

## 2019-04-14 DIAGNOSIS — Z23 Encounter for immunization: Secondary | ICD-10-CM

## 2019-04-14 NOTE — Progress Notes (Signed)
   Covid-19 Vaccination Clinic  Name:  Rosenda Geffrard    MRN: [id number] DOB: October 30, 1995  04/14/2019  Ms. Fedorko was observed post Covid-19 immunization for 15 minutes without incident. She was provided with Vaccine Information Sheet and instruction to access the V-Safe system.   Ms. Buffalo was instructed to call 911 with any severe reactions post vaccine: Marland Kitchen Difficulty breathing  . Swelling of face and throat  . A fast heartbeat  . A bad rash all over body  . Dizziness and weakness   Immunizations Administered    Name Date Dose VIS Date Route   Pfizer COVID-19 Vaccine 04/14/2019  4:12 PM 0.3 mL 12/30/2018 Intramuscular   Manufacturer: [employer], [employer]   Lot: [id number]   NDC: [address]-8

## 2019-04-27 ENCOUNTER — Ambulatory Visit (INDEPENDENT_AMBULATORY_CARE_PROVIDER_SITE_OTHER): Payer: Medicaid Other | Admitting: Primary Care

## 2019-05-08 ENCOUNTER — Ambulatory Visit: Payer: Medicaid Other | Attending: Internal Medicine

## 2019-05-08 DIAGNOSIS — Z23 Encounter for immunization: Secondary | ICD-10-CM

## 2019-05-08 NOTE — Progress Notes (Signed)
   Covid-19 Vaccination Clinic  Name:  Angel Hull    MRN: [id number] DOB: [birth year]  05/08/2019  Ms. Nevils was observed post Covid-19 immunization for 15 minutes without incident. She was provided with Vaccine Information Sheet and instruction to access the V-Safe system.   Ms. Aragones was instructed to call 911 with any severe reactions post vaccine: Marland Kitchen Difficulty breathing  . Swelling of face and throat  . A fast heartbeat  . A bad rash all over body  . Dizziness and weakness   Immunizations Administered    Name Date Dose VIS Date Route   Pfizer COVID-19 Vaccine 05/08/2019 10:46 AM 0.3 mL 03/15/2018 Intramuscular   Manufacturer: [employer], [employer]   Lot: [id number]   NDC: [address]-7

## 2019-05-08 NOTE — Progress Notes (Signed)
   Covid-19 Vaccination Clinic  Name:  Angel Hull    MRN: [id number] DOB: 07/22/1995  05/08/2019  [name] was observed post Covid-19 immunization for 15 minutes without incident. She was provided with Vaccine Information Sheet and instruction to access the V-Safe system.   [name] was instructed to call 911 with any severe reactions post vaccine: . Difficulty breathing  . Swelling of face and throat  . A fast heartbeat  . A bad rash all over body  . Dizziness and weakness   Immunizations Administered    Name Date Dose VIS Date Route   Pfizer COVID-19 Vaccine 05/08/2019 10:46 AM 0.3 mL 03/15/2018 Intramuscular   Manufacturer: Pfizer, Inc   Lot: [id number]   NDC: [address]-2     

## 2019-05-09 ENCOUNTER — Ambulatory Visit: Payer: Medicaid Other

## 2019-05-30 LAB — CUP PACEART REMOTE DEVICE CHECK
Date Time Interrogation Session: 20210511172713
Implantable Pulse Generator Implant Date: 20200622

## 2019-06-01 ENCOUNTER — Ambulatory Visit (INDEPENDENT_AMBULATORY_CARE_PROVIDER_SITE_OTHER): Payer: Medicaid Other | Admitting: *Deleted

## 2019-06-01 DIAGNOSIS — R55 Syncope and collapse: Secondary | ICD-10-CM

## 2019-06-05 NOTE — Progress Notes (Signed)
Carelink Summary Report / Loop Recorder 

## 2019-07-03 ENCOUNTER — Ambulatory Visit (INDEPENDENT_AMBULATORY_CARE_PROVIDER_SITE_OTHER): Payer: Medicaid Other | Admitting: *Deleted

## 2019-07-03 DIAGNOSIS — R55 Syncope and collapse: Secondary | ICD-10-CM

## 2019-07-03 LAB — CUP PACEART REMOTE DEVICE CHECK
Date Time Interrogation Session: 20210614001019
Implantable Pulse Generator Implant Date: 20200622

## 2019-07-04 NOTE — Progress Notes (Signed)
Carelink Summary Report / Loop Recorder 

## 2019-07-25 ENCOUNTER — Ambulatory Visit (INDEPENDENT_AMBULATORY_CARE_PROVIDER_SITE_OTHER): Payer: Medicaid Other | Admitting: Primary Care

## 2019-07-25 ENCOUNTER — Other Ambulatory Visit (HOSPITAL_COMMUNITY)
Admission: RE | Admit: 2019-07-25 | Discharge: 2019-07-25 | Disposition: A | Discharge status: Home / Self Care | Payer: Medicaid Other | Source: Ambulatory Visit | Attending: Primary Care | Admitting: Primary Care

## 2019-07-25 ENCOUNTER — Encounter (INDEPENDENT_AMBULATORY_CARE_PROVIDER_SITE_OTHER): Payer: Self-pay | Admitting: Primary Care

## 2019-07-25 ENCOUNTER — Other Ambulatory Visit: Payer: Self-pay

## 2019-07-25 VITALS — BP 116/69 | HR 104 | Temp 98.1°F | Ht 71.0 in | Wt 203.8 lb

## 2019-07-25 DIAGNOSIS — N898 Other specified noninflammatory disorders of vagina: Secondary | ICD-10-CM

## 2019-07-25 DIAGNOSIS — R300 Dysuria: Secondary | ICD-10-CM

## 2019-07-25 LAB — POCT URINALYSIS DIP (CLINITEK)
Bilirubin, UA: NEGATIVE
Blood, UA: NEGATIVE
Glucose, UA: NEGATIVE mg/dL
Ketones, POC UA: NEGATIVE mg/dL
Nitrite, UA: NEGATIVE
POC PROTEIN,UA: NEGATIVE
Spec Grav, UA: 1.025 (ref 1.010–1.025)
Urobilinogen, UA: 1 E.U./dL
pH, UA: 6.5 (ref 5.0–8.0)

## 2019-07-25 MED ORDER — PHENAZOPYRIDINE HCL 100 MG PO TABS: 100.0000 mg | ORAL_TABLET | Freq: Three times a day (TID) | ORAL | 0 refills | Status: DC | PRN

## 2019-07-25 NOTE — Patient Instructions (Signed)
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
## 2019-07-27 LAB — CERVICOVAGINAL ANCILLARY ONLY
Bacterial Vaginitis (gardnerella): POSITIVE — AB
Candida Glabrata: POSITIVE — AB
Candida Vaginitis: POSITIVE — AB
Chlamydia: NEGATIVE
Comment: NEGATIVE
Comment: NEGATIVE
Comment: NEGATIVE
Comment: NEGATIVE
Comment: NEGATIVE
Comment: NORMAL
Neisseria Gonorrhea: NEGATIVE
Trichomonas: NEGATIVE

## 2019-07-31 ENCOUNTER — Other Ambulatory Visit (INDEPENDENT_AMBULATORY_CARE_PROVIDER_SITE_OTHER): Payer: Self-pay | Admitting: Primary Care

## 2019-07-31 ENCOUNTER — Telehealth (INDEPENDENT_AMBULATORY_CARE_PROVIDER_SITE_OTHER): Payer: Self-pay | Admitting: Primary Care

## 2019-07-31 MED ORDER — METRONIDAZOLE 500 MG PO TABS
500.0000 mg | ORAL_TABLET | Freq: Two times a day (BID) | ORAL | 0 refills | Status: DC
Start: 2019-07-31 — End: 2019-08-25

## 2019-07-31 NOTE — Telephone Encounter (Signed)
Sent to PCP ?

## 2019-07-31 NOTE — Telephone Encounter (Signed)
Patient was seen on 07/25/2019 and was advised metronidazole would be prescribed, patient checking on the status, please advise  [id number] [place], [place] - [address] AT [address] Phone:  [phone]  Fax:  [phone]

## 2019-07-31 NOTE — Telephone Encounter (Signed)
Please review last patient labs- +BV,yeast. Do not see where medications where sent to pharmacy. Please review for new Rx

## 2019-08-25 ENCOUNTER — Encounter (HOSPITAL_COMMUNITY): Payer: Self-pay | Admitting: Emergency Medicine

## 2019-08-25 ENCOUNTER — Other Ambulatory Visit: Payer: Self-pay

## 2019-08-25 ENCOUNTER — Emergency Department (HOSPITAL_COMMUNITY)
Admission: EM | Admit: 2019-08-25 | Discharge: 2019-08-25 | Discharge status: Against Medical Advice | Payer: Medicaid Other | Attending: Emergency Medicine | Admitting: Emergency Medicine

## 2019-08-25 DIAGNOSIS — R55 Syncope and collapse: Secondary | ICD-10-CM | POA: Insufficient documentation

## 2019-08-25 DIAGNOSIS — R0602 Shortness of breath: Secondary | ICD-10-CM | POA: Insufficient documentation

## 2019-08-25 DIAGNOSIS — R519 Headache, unspecified: Secondary | ICD-10-CM | POA: Diagnosis not present

## 2019-08-25 LAB — BASIC METABOLIC PANEL
Anion gap: 11 (ref 5–15)
BUN: 7 mg/dL (ref 6–20)
CO2: 23 mmol/L (ref 22–32)
Calcium: 9.6 mg/dL (ref 8.9–10.3)
Chloride: 104 mmol/L (ref 98–111)
Creatinine, Ser: 0.88 mg/dL (ref 0.44–1.00)
GFR calc Af Amer: >60 mL/min (ref >60)
GFR calc non Af Amer: >60 mL/min (ref >60)
Glucose, Bld: 89 mg/dL (ref 70–99)
Potassium: 4.5 mmol/L (ref 3.5–5.1)
Sodium: 138 mmol/L (ref 135–145)

## 2019-08-25 LAB — CBC
HCT: 37 % (ref 36.0–46.0)
Hemoglobin: 11.7 g/dL — ABNORMAL LOW (ref 12.0–15.0)
MCH: 28.8 pg (ref 26.0–34.0)
MCHC: 31.6 g/dL (ref 30.0–36.0)
MCV: 91.1 fL (ref 80.0–100.0)
Platelets: 249 10*3/uL (ref 150–400)
RBC: 4.06 MIL/uL (ref 3.87–5.11)
RDW: 13.1 % (ref 11.5–15.5)
WBC: 4.5 10*3/uL (ref 4.0–10.5)
nRBC: 0 % (ref 0.0–0.2)

## 2019-08-25 LAB — I-STAT BETA HCG BLOOD, ED (MC, WL, AP ONLY): I-stat hCG, quantitative: <5 m[IU]/mL (ref <5)

## 2019-08-25 MED ORDER — LACTATED RINGERS IV BOLUS: 1000.0000 mL | Freq: Once | INTRAVENOUS | Status: DC

## 2019-08-25 MED ORDER — SODIUM CHLORIDE 0.9% FLUSH: 3.0000 mL | Freq: Once | INTRAVENOUS | Status: DC

## 2019-08-25 MED ORDER — ACETAMINOPHEN 500 MG PO TABS
1000.0000 mg | ORAL_TABLET | Freq: Once | ORAL | Status: DC
  Filled 2019-08-25: qty 2

## 2019-08-25 NOTE — ED Notes (Signed)
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
## 2019-08-25 NOTE — ED Notes (Signed)
Patient stated she wanted to go to the bathroom sent patient to bathroom 7 , went into another patient room to assist him and she knew she needed IV bolus and medication. Patient never returned to her room.

## 2019-08-25 NOTE — ED Triage Notes (Signed)
Pt reports she has been on her menstrual cycle x2 weeks, reports it is very heavy. States she has been feeling like she was going to pass out. States she is currently on birth control, hx of heavy menstrual cycle, denies need transfusion in the past.

## 2019-09-05 ENCOUNTER — Encounter (HOSPITAL_COMMUNITY): Payer: Self-pay | Admitting: Emergency Medicine

## 2019-09-05 ENCOUNTER — Other Ambulatory Visit: Payer: Self-pay

## 2019-09-05 ENCOUNTER — Ambulatory Visit (HOSPITAL_COMMUNITY)
Admission: EM | Admit: 2019-09-05 | Discharge: 2019-09-05 | Disposition: A | Discharge status: Home / Self Care | Payer: Medicaid Other

## 2019-09-05 ENCOUNTER — Emergency Department (HOSPITAL_COMMUNITY)
Admission: EM | Admit: 2019-09-05 | Discharge: 2019-09-05 | Disposition: A | Discharge status: Home / Self Care | Payer: Medicaid Other | Attending: Emergency Medicine | Admitting: Emergency Medicine

## 2019-09-05 ENCOUNTER — Ambulatory Visit (INDEPENDENT_AMBULATORY_CARE_PROVIDER_SITE_OTHER): Payer: Self-pay

## 2019-09-05 DIAGNOSIS — R519 Headache, unspecified: Secondary | ICD-10-CM | POA: Insufficient documentation

## 2019-09-05 DIAGNOSIS — Z87898 Personal history of other specified conditions: Secondary | ICD-10-CM | POA: Diagnosis not present

## 2019-09-05 DIAGNOSIS — Z5321 Procedure and treatment not carried out due to patient leaving prior to being seen by health care provider: Secondary | ICD-10-CM | POA: Diagnosis not present

## 2019-09-05 DIAGNOSIS — R55 Syncope and collapse: Secondary | ICD-10-CM | POA: Insufficient documentation

## 2019-09-05 DIAGNOSIS — H539 Unspecified visual disturbance: Secondary | ICD-10-CM

## 2019-09-05 LAB — URINALYSIS, ROUTINE W REFLEX MICROSCOPIC
Bilirubin Urine: NEGATIVE
Glucose, UA: NEGATIVE mg/dL
Hgb urine dipstick: NEGATIVE
Ketones, ur: NEGATIVE mg/dL
Leukocytes,Ua: NEGATIVE
Nitrite: NEGATIVE
Protein, ur: NEGATIVE mg/dL
Specific Gravity, Urine: 1.008 (ref 1.005–1.030)
pH: 6 (ref 5.0–8.0)

## 2019-09-05 LAB — CBC
HCT: 39 % (ref 36.0–46.0)
Hemoglobin: 12 g/dL (ref 12.0–15.0)
MCH: 28.6 pg (ref 26.0–34.0)
MCHC: 30.8 g/dL (ref 30.0–36.0)
MCV: 92.9 fL (ref 80.0–100.0)
Platelets: 277 10*3/uL (ref 150–400)
RBC: 4.2 MIL/uL (ref 3.87–5.11)
RDW: 12.7 % (ref 11.5–15.5)
WBC: 5.1 10*3/uL (ref 4.0–10.5)
nRBC: 0 % (ref 0.0–0.2)

## 2019-09-05 LAB — BASIC METABOLIC PANEL
Anion gap: 10 (ref 5–15)
BUN: 10 mg/dL (ref 6–20)
CO2: 24 mmol/L (ref 22–32)
Calcium: 9.4 mg/dL (ref 8.9–10.3)
Chloride: 105 mmol/L (ref 98–111)
Creatinine, Ser: 0.76 mg/dL (ref 0.44–1.00)
GFR calc Af Amer: >60 mL/min (ref >60)
GFR calc non Af Amer: >60 mL/min (ref >60)
Glucose, Bld: 91 mg/dL (ref 70–99)
Potassium: 3.7 mmol/L (ref 3.5–5.1)
Sodium: 139 mmol/L (ref 135–145)

## 2019-09-05 LAB — I-STAT BETA HCG BLOOD, ED (MC, WL, AP ONLY): I-stat hCG, quantitative: <5 m[IU]/mL (ref <5)

## 2019-09-05 LAB — CBG MONITORING, ED: Glucose-Capillary: 112 mg/dL — ABNORMAL HIGH (ref 70–99)

## 2019-09-05 NOTE — ED Notes (Signed)
Patient stated on arrival that she has not had LOC.

## 2019-09-05 NOTE — ED Triage Notes (Signed)
Pt presents with headache and "seeing stars." states she wakes up and "seeing stars" c/o blurred vision and dizziness. Pt states she "passes out every day." states this has been happening for 2 years.

## 2019-09-05 NOTE — ED Notes (Signed)
Patient is being discharged from the Urgent Care and sent to the Emergency Department via [facility] Per J. Darr, PA patient is in need of higher level of care due to HA, HTN atient is aware and verbalizes understanding of plan of care.  Vitals:   09/05/19 1721  BP: (!) 127/103  Pulse: (!) 104  Resp: 18  Temp: 98.9 F (37.2 C)  SpO2: 97%
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
## 2019-09-05 NOTE — Discharge Instructions (Addendum)
Given your severe headache, you need further evaluation in the emergency department. Our staff will escort you to [facility] ED

## 2019-09-05 NOTE — ED Triage Notes (Signed)
Patient sent by [facility] for further eval of syncope X2 years. Patient also reports HA. Patient in NAD

## 2019-09-05 NOTE — ED Notes (Signed)
Pt stated that after drinking 3 bottles of water and using the restroom while being here that she feel better and she is going to leave.

## 2019-09-05 NOTE — Telephone Encounter (Signed)
Patient called and says she's been having a constant headache since this weekend. She says the pain is all over her head, 10/10. She says the pain is so bad she sees stars, like when she's about to pass out. She says she went to the ED before and it took 4 hours. She denies any other symptoms. She asked for the address to the urgent care. I advised where the UC is located beside [facility], she verbalized understanding and hung up the phone.  Reason for Disposition  [1] SEVERE headache (e.g., excruciating) AND [2] "worst headache" of life  Answer Assessment - Initial Assessment Questions 1. LOCATION: "Where does it hurt?"      All over 2. ONSET: "When did the headache start?" (Minutes, hours or days)      Over the weekend 3. PATTERN: "Does the pain come and go, or has it been constant since it started?"     Constant 4. SEVERITY: "How bad is the pain?" and "What does it keep you from doing?"  (e.g., Scale 1-10; mild, moderate, or severe)   - MILD (1-3): doesn't interfere with normal activities    - MODERATE (4-7): interferes with normal activities or awakens from sleep    - SEVERE (8-10): excruciating pain, unable to do any normal activities        10 5. RECURRENT SYMPTOM: "Have you ever had headaches before?" If Yes, ask: "When was the last time?" and "What happened that time?"       Yes.  6. CAUSE: "What do you think is causing the headache?"      I don't know 7. MIGRAINE: "Have you been diagnosed with migraine headaches?" If Yes, ask: "Is this headache similar?"      Yes 8. HEAD INJURY: "Has there been any recent injury to the head?"      No 9. OTHER SYMPTOMS: "Do you have any other symptoms?" (fever, stiff neck, eye pain, sore throat, cold symptoms)     Seeing stars 10. PREGNANCY: "Is there any chance you are pregnant?" "When was your last menstrual period?"       I don't know  Protocols used: HEADACHE-A-AH

## 2019-09-13 ENCOUNTER — Emergency Department (HOSPITAL_COMMUNITY): Payer: Medicaid Other

## 2019-09-13 ENCOUNTER — Emergency Department (HOSPITAL_COMMUNITY)
Admission: EM | Admit: 2019-09-13 | Discharge: 2019-09-13 | Disposition: A | Discharge status: Home / Self Care | Payer: Medicaid Other | Attending: Emergency Medicine | Admitting: Emergency Medicine

## 2019-09-13 ENCOUNTER — Encounter (HOSPITAL_COMMUNITY): Payer: Self-pay | Admitting: Emergency Medicine

## 2019-09-13 DIAGNOSIS — R569 Unspecified convulsions: Secondary | ICD-10-CM | POA: Insufficient documentation

## 2019-09-13 DIAGNOSIS — R55 Syncope and collapse: Secondary | ICD-10-CM | POA: Diagnosis not present

## 2019-09-13 LAB — CBC
HCT: 38.6 % (ref 36.0–46.0)
Hemoglobin: 12 g/dL (ref 12.0–15.0)
MCH: 28.4 pg (ref 26.0–34.0)
MCHC: 31.1 g/dL (ref 30.0–36.0)
MCV: 91.5 fL (ref 80.0–100.0)
Platelets: 266 10*3/uL (ref 150–400)
RBC: 4.22 MIL/uL (ref 3.87–5.11)
RDW: 13.1 % (ref 11.5–15.5)
WBC: 3.4 10*3/uL — ABNORMAL LOW (ref 4.0–10.5)
nRBC: 0 % (ref 0.0–0.2)

## 2019-09-13 LAB — BASIC METABOLIC PANEL
Anion gap: 10 (ref 5–15)
BUN: 5 mg/dL — ABNORMAL LOW (ref 6–20)
CO2: 25 mmol/L (ref 22–32)
Calcium: 9.6 mg/dL (ref 8.9–10.3)
Chloride: 102 mmol/L (ref 98–111)
Creatinine, Ser: 0.82 mg/dL (ref 0.44–1.00)
GFR calc Af Amer: >60 mL/min (ref >60)
GFR calc non Af Amer: >60 mL/min (ref >60)
Glucose, Bld: 107 mg/dL — ABNORMAL HIGH (ref 70–99)
Potassium: 3.5 mmol/L (ref 3.5–5.1)
Sodium: 137 mmol/L (ref 135–145)

## 2019-09-13 LAB — I-STAT BETA HCG BLOOD, ED (MC, WL, AP ONLY): I-stat hCG, quantitative: <5 m[IU]/mL (ref <5)

## 2019-09-13 NOTE — ED Notes (Signed)
RN to see pt , pt resting on stretcher , denies any needs at this time, pt appears to be in NAD at this time

## 2019-09-13 NOTE — ED Triage Notes (Signed)
Pt arrives via [facility] from work, per employer, they stated pt laid on the floor and was acting like she was having a seizure, however, was alert and oriented throughout said episode. Pt remains a/ox4. No seizure activity witnessed by ems, no trauma noted.
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
## 2019-09-13 NOTE — Discharge Instructions (Signed)

## 2019-09-13 NOTE — ED Notes (Signed)
Pt sitting outside. 

## 2019-09-27 ENCOUNTER — Telehealth: Payer: Self-pay | Admitting: *Deleted

## 2019-09-27 NOTE — Telephone Encounter (Signed)
Patient name and DOB verified. Patient was informed that last results was from July, 2021. These results were given to patient. She also inquired about an OV with PCP to f/u with syncope episodes. She stated that there were no appointment available. Informed patient of the availabilities at 9:30, Tuesday - Friday of the week of September 12. Patient stated that she didn't feel like the provider would be able to provide the service.   Copied from CRM [id number]. Topic: General - Other >> Sep 07, 2019  4:03 PM Laural Benes, [place] C wrote: Reason for CRM: pt called in for a call back for her lab results.   Please assist.   [phone]

## 2019-10-09 ENCOUNTER — Ambulatory Visit (HOSPITAL_COMMUNITY)
Admission: EM | Admit: 2019-10-09 | Discharge: 2019-10-09 | Disposition: A | Discharge status: Other Acute Inpatient Hospital | Payer: Medicaid Other | Attending: Emergency Medicine | Admitting: Emergency Medicine

## 2019-10-09 ENCOUNTER — Emergency Department (HOSPITAL_COMMUNITY): Payer: Medicaid Other

## 2019-10-09 ENCOUNTER — Emergency Department (HOSPITAL_COMMUNITY)
Admission: EM | Admit: 2019-10-09 | Discharge: 2019-10-10 | Disposition: A | Discharge status: Home / Self Care | Payer: Medicaid Other | Attending: Emergency Medicine | Admitting: Emergency Medicine

## 2019-10-09 ENCOUNTER — Other Ambulatory Visit: Payer: Self-pay

## 2019-10-09 ENCOUNTER — Encounter (HOSPITAL_COMMUNITY): Payer: Self-pay | Admitting: *Deleted

## 2019-10-09 DIAGNOSIS — Z1330 Encounter for screening examination for mental health and behavioral disorders, unspecified: Secondary | ICD-10-CM | POA: Insufficient documentation

## 2019-10-09 DIAGNOSIS — R45851 Suicidal ideations: Secondary | ICD-10-CM | POA: Diagnosis not present

## 2019-10-09 DIAGNOSIS — U071 COVID-19: Secondary | ICD-10-CM | POA: Diagnosis not present

## 2019-10-09 DIAGNOSIS — F329 Major depressive disorder, single episode, unspecified: Secondary | ICD-10-CM | POA: Diagnosis present

## 2019-10-09 LAB — CBC WITH DIFFERENTIAL/PLATELET
Abs Immature Granulocytes: 0 10*3/uL (ref 0.00–0.07)
Basophils Absolute: 0 10*3/uL (ref 0.0–0.1)
Basophils Relative: 0 %
Eosinophils Absolute: 0 10*3/uL (ref 0.0–0.5)
Eosinophils Relative: 0 %
HCT: 42.9 % (ref 36.0–46.0)
Hemoglobin: 13.5 g/dL (ref 12.0–15.0)
Immature Granulocytes: 0 %
Lymphocytes Relative: 25 %
Lymphs Abs: 0.8 10*3/uL (ref 0.7–4.0)
MCH: 28.6 pg (ref 26.0–34.0)
MCHC: 31.5 g/dL (ref 30.0–36.0)
MCV: 90.9 fL (ref 80.0–100.0)
Monocytes Absolute: 0.7 10*3/uL (ref 0.1–1.0)
Monocytes Relative: 22 %
Neutro Abs: 1.6 10*3/uL — ABNORMAL LOW (ref 1.7–7.7)
Neutrophils Relative %: 53 %
Platelets: 196 10*3/uL (ref 150–400)
RBC: 4.72 MIL/uL (ref 3.87–5.11)
RDW: 12.5 % (ref 11.5–15.5)
WBC: 3 10*3/uL — ABNORMAL LOW (ref 4.0–10.5)
nRBC: 0 % (ref 0.0–0.2)

## 2019-10-09 LAB — I-STAT BETA HCG BLOOD, ED (NOT ORDERABLE): I-stat hCG, quantitative: <5 m[IU]/mL (ref <5)

## 2019-10-09 LAB — COMPREHENSIVE METABOLIC PANEL
ALT: 14 U/L (ref 0–44)
AST: 19 U/L (ref 15–41)
Albumin: 4.1 g/dL (ref 3.5–5.0)
Alkaline Phosphatase: 56 U/L (ref 38–126)
Anion gap: 11 (ref 5–15)
BUN: 5 mg/dL — ABNORMAL LOW (ref 6–20)
CO2: 24 mmol/L (ref 22–32)
Calcium: 9.2 mg/dL (ref 8.9–10.3)
Chloride: 102 mmol/L (ref 98–111)
Creatinine, Ser: 0.98 mg/dL (ref 0.44–1.00)
GFR calc Af Amer: >60 mL/min (ref >60)
GFR calc non Af Amer: >60 mL/min (ref >60)
Glucose, Bld: 82 mg/dL (ref 70–99)
Potassium: 3.5 mmol/L (ref 3.5–5.1)
Sodium: 137 mmol/L (ref 135–145)
Total Bilirubin: 0.8 mg/dL (ref 0.3–1.2)
Total Protein: 7.5 g/dL (ref 6.5–8.1)

## 2019-10-09 LAB — ETHANOL: Alcohol, Ethyl (B): <10 mg/dL (ref <10)

## 2019-10-09 LAB — SALICYLATE LEVEL: Salicylate Lvl: <7 mg/dL — ABNORMAL LOW (ref 7.0–30.0)

## 2019-10-09 LAB — ACETAMINOPHEN LEVEL: Acetaminophen (Tylenol), Serum: <10 ug/mL — ABNORMAL LOW (ref 10–30)

## 2019-10-09 LAB — SARS CORONAVIRUS 2 BY RT PCR (HOSPITAL ORDER, PERFORMED IN ~~LOC~~ HOSPITAL LAB): SARS Coronavirus 2: POSITIVE — AB

## 2019-10-09 MED ORDER — ONDANSETRON 4 MG PO TBDP
4.0000 mg | ORAL_TABLET | Freq: Once | ORAL | Status: AC
  Administered 2019-10-09: 4 mg via ORAL
  Filled 2019-10-09: qty 1

## 2019-10-09 MED ORDER — BENZONATATE 100 MG PO CAPS
100.0000 mg | ORAL_CAPSULE | Freq: Three times a day (TID) | ORAL | Status: DC
  Administered 2019-10-09: 100 mg via ORAL
  Filled 2019-10-09: qty 1
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
## 2019-10-09 NOTE — ED Triage Notes (Signed)
Pt arrived from [facility] via [facility].  Pt voluntary and sent here to the ED because the [facility] unable to care for patients with positive COVID results.  Pt has a notable cough.

## 2019-10-09 NOTE — ED Notes (Signed)
TTS in progress.  Confirmed NKDA and that patient takes no daily medications. Pharmacy made aware.
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
## 2019-10-09 NOTE — ED Notes (Signed)
The patient is being transferred to ROOM 46. Her meal tray is at bedside. A black cell phone (Iphone) and white charger with black cube placed in patients locker. The patient has agreed and says "My mom can tell everyone where I am" She denies pain at this time and reports "I feel so much better after that nausea and cough medications. Cords have been removed from room.   -Awaiting TTS -Need sitter
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
## 2019-10-09 NOTE — ED Notes (Signed)
Patient resting in bed. NAD noted. TTS has been completed, awaiting plan of care. Patient updated on same and verbalized understanding, denies further needs. Room safe and near nurse station. No sitter available.

## 2019-10-10 MED ORDER — STERILE WATER FOR INJECTION IJ SOLN
INTRAMUSCULAR | Status: AC
  Filled 2019-10-10: qty 10

## 2019-10-10 MED ORDER — ZIPRASIDONE MESYLATE 20 MG IM SOLR
10.0000 mg | Freq: Once | INTRAMUSCULAR | Status: AC
  Administered 2019-10-10: 10 mg via INTRAMUSCULAR
  Filled 2019-10-10: qty 20

## 2019-10-10 MED ORDER — BENZONATATE 100 MG PO CAPS: 100.0000 mg | ORAL_CAPSULE | Freq: Three times a day (TID) | ORAL | 0 refills | Status: DC

## 2019-10-10 MED ORDER — PROMETHAZINE-DM 6.25-15 MG/5ML PO SYRP: 5.0000 mL | ORAL_SOLUTION | Freq: Four times a day (QID) | ORAL | 0 refills | Status: DC | PRN

## 2019-10-10 MED ORDER — ONDANSETRON 4 MG PO TBDP: 4.0000 mg | ORAL_TABLET | Freq: Three times a day (TID) | ORAL | 0 refills | Status: DC | PRN

## 2019-10-10 NOTE — Progress Notes (Signed)
Patient ID: Angel Hull, female   DOB: [birth year], 24 y.o.   MRN: [id number]   Spoke to patients nurse to provide disposition. As per nurse,patient administered Geodon due to behavioral issue. Called and discussed case with Dr. Dwyane Dee. Patient continues to not met criteria for inpatient psychiatric hospitalization and disposition remains the same.
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
## 2019-10-10 NOTE — ED Provider Notes (Signed)
Patient reevaluated this time.  Informed that she is Covid positive she is aware of this.  She is having minimal symptoms with a cough and has had some episodes of nausea without vomiting.  No abdominal pain, chest pain, shortness of breath.  No lightheadedness or dizziness.  She is tolerating p.o. and was ambulated without desaturation reaction levels.  We will discharge with Zofran, Tylenol and ibuprofen recommendations and fluticasone for nasal congestion and benzonatate and Promethazine DM.  She was cleared by psychiatry.  She will follow up outpatient.   Solon Augusta [place], [place] 10/10/19 1616    Tegeler, Canary Brim, MD 10/10/19 [phone]

## 2019-10-10 NOTE — ED Notes (Signed)
Pt ambulated in room spo2 remained >95% on RA

## 2019-10-10 NOTE — ED Notes (Signed)
Jason Berry, NP, recommends overnight observation for safety and stabilization with psych reassessment in the AM.   

## 2019-10-10 NOTE — ED Notes (Signed)
Patient verbalizes understanding of discharge instructions. Opportunity for questioning and answers were provided. Armband removed by staff, pt discharged from ED ambulatory.   

## 2019-10-10 NOTE — ED Notes (Signed)
Report and care to Melissa, RN.

## 2019-10-10 NOTE — Discharge Instructions (Addendum)
Your diagnosed with COVID-19.  Please drink plenty fluids, take Tylenol ibuprofen described below.\ Please use Zofran as needed for nausea.  Use Promethazine DM at nighttime for cough suppression and benzonatate during the day.  I have also prescribed you fluticasone which you should use twice per day.  He can use 2 squirts is in each nostril in the morning and 2 sprays in each nostril evening.  Please follow up with one of the following outpatient agencies:   [facility] * (Intensive outpatient, partial hospitalization, individual therapy, and medication management) [address] [place], [place] [address] [phone] [facility] * (medication management and therapy) [address] [place], [place] [address] [phone] [facility] (Therapy only)* (takes [place] and most major insurances) [place]:  [address] [facility] [place], [place]  Phone: [phone] [place]:  [address] [place], [place] [address]   Phone: [phone] [place]:  [address] [place], [place] [address]  Phone: [phone] [facility]  (specializes in trauma therapy, substance abuse and mood disorders. Takes patients without insurance for little to no cost out of pocket. Also takes most major insurances) [address],  [place], [place] [address] [phone] [facility] * (therapy and medication management) [address] [place], [place] [address] [phone] [facility] (Medication Management) [address] [place], [place] [address] [phone] [facility] * (therapy only) [address] [place], [place] [address] [phone] [facility] [address] [place] [address] [phone] [facility] * [address] [place], [place] [address] [phone] [facility]* (accepts  non-insured)  [address] [place], [place] [address] [phone] Walk in Monday-Friday 8am-3pm [facility]* (takes sliding scale and Medicaid as well as other major insurances)  [place]:   [address] [place]  Phone: [phone] [place]:   [facility] [address] [place], [place]  Phone: [phone] Walk in Monday-Friday 830am-230pm [facility]* (accepts [employer] non-insured) [address] [place], [place] [address] [phone] Walk in Monday-Friday 8am-5pm

## 2019-10-10 NOTE — ED Notes (Signed)
Patient resting on stretcher with HOB elevated, respirations even and nonlabored. NAD noted at this time.

## 2019-10-30 ENCOUNTER — Encounter: Payer: Self-pay | Admitting: Neurology

## 2019-10-30 ENCOUNTER — Institutional Professional Consult (permissible substitution): Payer: Medicaid Other | Admitting: Neurology

## 2019-12-06 LAB — CUP PACEART REMOTE DEVICE CHECK
Date Time Interrogation Session: 20211116004620
Implantable Pulse Generator Implant Date: 20200622

## 2019-12-07 ENCOUNTER — Ambulatory Visit (INDEPENDENT_AMBULATORY_CARE_PROVIDER_SITE_OTHER): Payer: Medicaid Other

## 2019-12-07 ENCOUNTER — Telehealth: Payer: Self-pay | Admitting: Emergency Medicine

## 2019-12-07 DIAGNOSIS — R55 Syncope and collapse: Secondary | ICD-10-CM | POA: Diagnosis not present

## 2019-12-07 NOTE — Telephone Encounter (Signed)
Patient is not near monitor and it is in a different location from her at this time. Number to [facility] and office hours provided to call back to do transmission.

## 2019-12-08 NOTE — Progress Notes (Signed)
Carelink Summary Report / Loop Recorder 

## 2019-12-27 NOTE — Telephone Encounter (Signed)
LMOVM for pt to send manual transmission.  

## 2020-01-05 NOTE — Telephone Encounter (Signed)
Detailed message left on VM per DPR to send remote transmission from loop recorder. [facility] # and office hours provided.

## 2020-02-26 ENCOUNTER — Ambulatory Visit (INDEPENDENT_AMBULATORY_CARE_PROVIDER_SITE_OTHER): Payer: Self-pay | Admitting: *Deleted

## 2020-02-26 NOTE — Telephone Encounter (Signed)
Moderate lower abdominal pain. Does not radiate, the pain stays in the middle.Passing gas eases cramps somewhat.  Describes as cramps that come and go usually after eating. LBM 2 days ago-usually goes more frequently.Had to strain with that stool.  No difficulty voiding. No fever. Drinking little water daily.   Has not tried any OTC medications. Care Advice: Increase daily water intake to 5-6 glasses daily, use OTC maalox/pepto after a meal for the next 24 hours. Use mild OTC laxative. Increase fibrous foods. She will call back if advice does not help. If pain worsens/constant for >than 2 hours or fever develops seek treatment immediately. Patient voiced understanding information.  Reason for Disposition . [1] MILD-MODERATE pain AND [2] comes and goes (cramps)  Answer Assessment - Initial Assessment Questions 1. LOCATION: "Where does it hurt?"      Lower abdomen pain 2. RADIATION: "Does the pain shoot anywhere else?" (e.g., chest, back)     3. ONSET: "When did the pain begin?" (e.g., minutes, hours or days ago)      Few days ago. 4. SUDDEN: "Gradual or sudden onset?"     gradually 5. PATTERN "Does the pain come and go, or is it constant?"    - If constant: "Is it getting better, staying the same, or worsening?"      (Note: Constant means the pain never goes away completely; most serious pain is constant and it progresses)     - If intermittent: "How long does it last?" "Do you have pain now?"     (Note: Intermittent means the pain goes away completely between bouts)     Comes and goes 6. SEVERITY: "How bad is the pain?"  (e.g., Scale 1-10; mild, moderate, or severe)   - MILD (1-3): doesn't interfere with normal activities, abdomen soft and not tender to touch    - MODERATE (4-7): interferes with normal activities or awakens from sleep, tender to touch    - SEVERE (8-10): excruciating pain, doubled over, unable to do any normal activities      Severe sharp pain 7. RECURRENT SYMPTOM: "Have you  ever had this type of stomach pain before?" If Yes, ask: "When was the last time?" and "What happened that time?"      no 8. CAUSE: "What do you think is causing the stomach pain?"     Abdomen feels bloated. 9. RELIEVING/AGGRAVATING FACTORS: "What makes it better or worse?" (e.g., movement, antacids, bowel movement)     Being still helps. 10. OTHER SYMPTOMS: "Has there been any vomiting, diarrhea, constipation, or urine problems?"       Mild constipation 11. PREGNANCY: "Is there any chance you are pregnant?" "When was your last menstrual period?"       no  Protocols used: ABDOMINAL PAIN - [facility]

## 2020-09-02 ENCOUNTER — Encounter (HOSPITAL_COMMUNITY): Payer: Self-pay | Admitting: *Deleted

## 2020-09-02 ENCOUNTER — Other Ambulatory Visit: Payer: Self-pay

## 2020-09-02 ENCOUNTER — Emergency Department (HOSPITAL_COMMUNITY)
Admission: EM | Admit: 2020-09-02 | Discharge: 2020-09-02 | Disposition: A | Discharge status: Home / Self Care | Payer: Medicaid Other | Attending: Physician Assistant | Admitting: Physician Assistant

## 2020-09-02 ENCOUNTER — Telehealth (INDEPENDENT_AMBULATORY_CARE_PROVIDER_SITE_OTHER): Payer: Medicaid Other | Admitting: Nurse Practitioner

## 2020-09-02 DIAGNOSIS — R300 Dysuria: Secondary | ICD-10-CM | POA: Diagnosis present

## 2020-09-02 DIAGNOSIS — R102 Pelvic and perineal pain: Secondary | ICD-10-CM | POA: Insufficient documentation

## 2020-09-02 DIAGNOSIS — Z5321 Procedure and treatment not carried out due to patient leaving prior to being seen by health care provider: Secondary | ICD-10-CM | POA: Diagnosis not present

## 2020-09-02 LAB — URINALYSIS, ROUTINE W REFLEX MICROSCOPIC
Bilirubin Urine: NEGATIVE
Glucose, UA: NEGATIVE mg/dL
Ketones, ur: NEGATIVE mg/dL
Nitrite: POSITIVE — AB
Protein, ur: 30 mg/dL — AB
Specific Gravity, Urine: 1.023 (ref 1.005–1.030)
WBC, UA: >50 WBC/hpf — ABNORMAL HIGH (ref 0–5)
pH: 7 (ref 5.0–8.0)

## 2020-09-02 LAB — HIV ANTIBODY (ROUTINE TESTING W REFLEX): HIV Screen 4th Generation wRfx: NONREACTIVE

## 2020-09-02 NOTE — ED Provider Notes (Signed)
Emergency Medicine Provider Triage Evaluation Note  Angel Hull , a 25 y.o. female  was evaluated in triage.  Pt complains of vaginal pain. She reports she does not use condoms.  Review of Systems  Positive: Vaginal pain Negative: Vaginal discharge, rashes  Physical Exam  BP (!) 146/89 (BP Location: Right Arm)   Pulse 96   Temp 98.4 F (36.9 C)   Resp 14   LMP 08/19/2020   SpO2 100%  Gen:   Awake, no distress   Resp:  Normal effort  MSK:   Moves extremities without difficulty   Medical Decision Making  Medically screening exam initiated at 12:08 PM.  Appropriate orders placed.  Angel Hull was informed that the remainder of the evaluation will be completed by another provider, this initial triage assessment does not replace that evaluation, and the importance of remaining in the ED until their evaluation is complete.     Samson Frederic, Hakop Humbarger S, PA-C 09/02/20 1210    Rozelle Logan, DO 09/03/20 1508

## 2020-09-02 NOTE — ED Triage Notes (Signed)
Pt reports pain with urination and pelvic pressure for several days. Denies vaginal discharge or fever.

## 2020-09-02 NOTE — ED Notes (Signed)
Called pt for vitals. No response inside the lobby or outside.

## 2020-09-02 NOTE — ED Notes (Signed)
Called pt no answer X2 

## 2020-09-02 NOTE — ED Notes (Signed)
Pt named called for updated vitals, no response 

## 2020-09-03 LAB — RPR: RPR Ser Ql: NONREACTIVE

## 2020-09-04 ENCOUNTER — Ambulatory Visit (INDEPENDENT_AMBULATORY_CARE_PROVIDER_SITE_OTHER): Payer: Self-pay | Admitting: *Deleted

## 2020-09-04 NOTE — Telephone Encounter (Signed)
Reason for Disposition . All other urine symptoms  Answer Assessment - Initial Assessment Questions 1. SYMPTOM: "What's the main symptom you're concerned about?" (e.g., frequency, incontinence)     Burning during urination 2. ONSET: "When did the  burning  start?"     Couple of days ago  3. PAIN: "Is there any pain?" If Yes, ask: "How bad is it?" (Scale: 1-10; mild, moderate, severe)     severe 4. CAUSE: "What do you think is causing the symptoms?"    na 5. OTHER SYMPTOMS: "Do you have any other symptoms?" (e.g., fever, flank pain, blood in urine, pain with urination)     Blood in urine noted in specimen from ED 09/02/20, pain, burning with urination 6. PREGNANCY: "Is there any chance you are pregnant?" "When was your last menstrual period?"     na  Protocols used: Urinary Symptoms-A-AH

## 2020-09-04 NOTE — Telephone Encounter (Signed)
Patient says went to ER and was diagnosed with Uti. She wants to know can something be sent to the pharmacy for her. Please call back.      [id number] [place], [place] - [address] AT [facility]  Phone: [phone]  Fax: [phone]   Patient called to review request for medication. Patient reports she went to ED on 09/02/20. Chart review shows disposition eloped. Patient called x 2 no answer while in ED. Called patient to review symptoms. C/o painful urination , burning severe. Denies fever, blood in urine. Earliest appt 09/11/20. Appt was not scheduled. Please advise if patient can get earlier appt or medication for symptoms. Please advise. Care advise given . Patient verbalized understanding of care advise and to call back or go to ED if symptoms worsen.

## 2020-09-04 NOTE — Telephone Encounter (Signed)
It is unclear if patient left before treatment could be prescribed or provided. Can medication be sent to treat patient for UTI.

## 2020-09-05 MED ORDER — NITROFURANTOIN MONOHYD MACRO 100 MG PO CAPS: 100.0000 mg | ORAL_CAPSULE | Freq: Two times a day (BID) | ORAL | 0 refills | Status: DC

## 2020-09-05 NOTE — Addendum Note (Signed)
Addended byHoy Register on: 09/05/2020 02:04 PM   Modules accepted: Orders

## 2020-09-05 NOTE — Telephone Encounter (Signed)
Prescription has been sent to pharmacy.

## 2021-04-24 IMAGING — CT CT HEAD W/O CM
4 series · 16 of 47 positions shown, 18 images · non-contrast
Comparison: Brain MRI dated 10/17/2018.

CLINICAL DATA: 23-year-old female with seizure.

EXAM:
CT HEAD WITHOUT CONTRAST
TECHNIQUE: Contiguous axial images were obtained from the base of the skull
through the vertex without intravenous contrast.

[Series 3: head wo · axial · 0.44mm/px · z∈[-106,+14]mm · 7 of 33 slices shown, 9 images]
[im 5/33  brain]
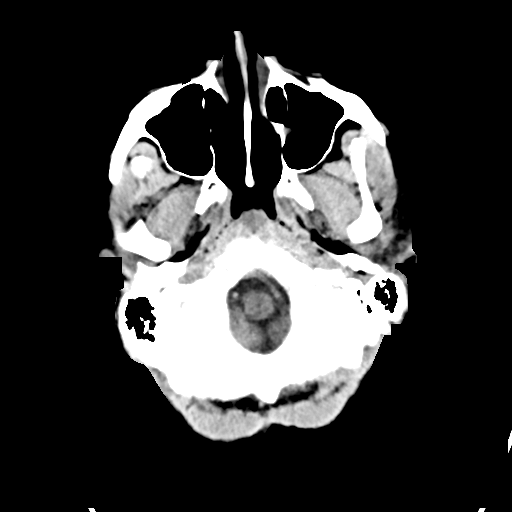
[im 5/33  bone]
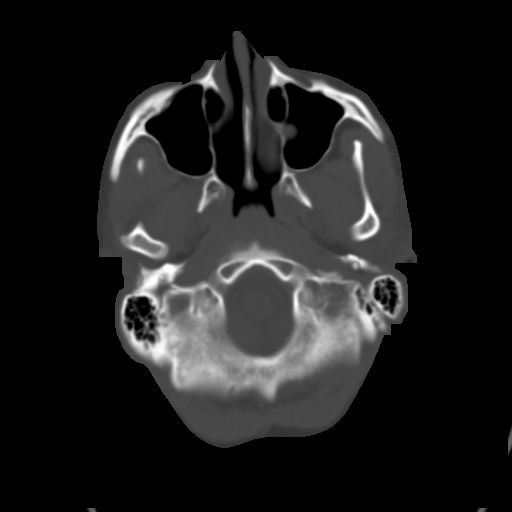
[im 9/33  brain]
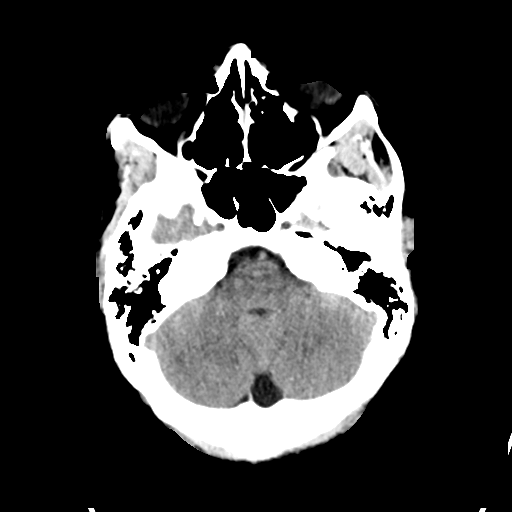
[im 13/33  brain]
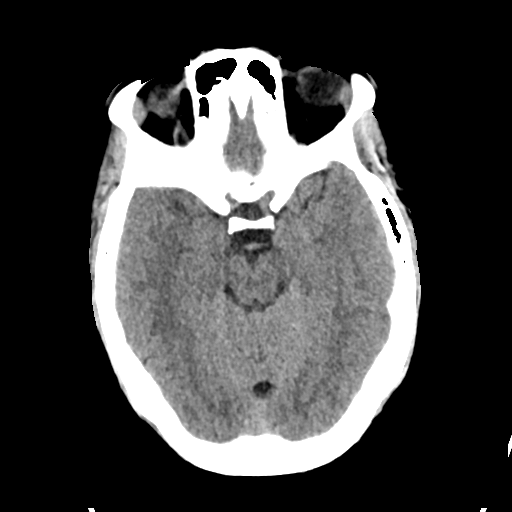
[im 17/33  brain]
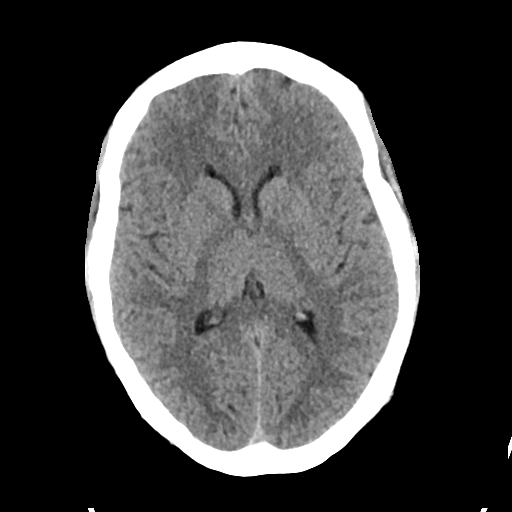
[im 21/33  brain]
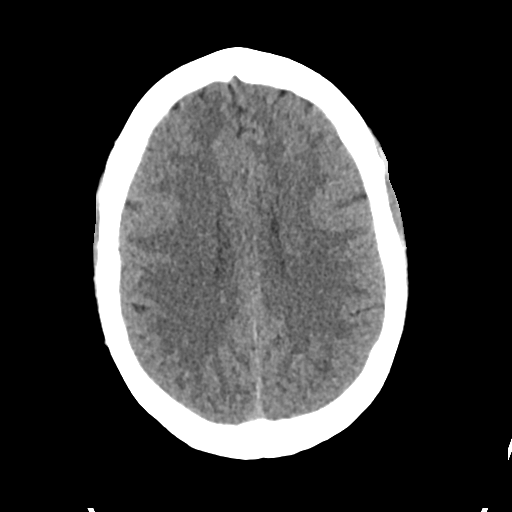
[im 21/33  bone]
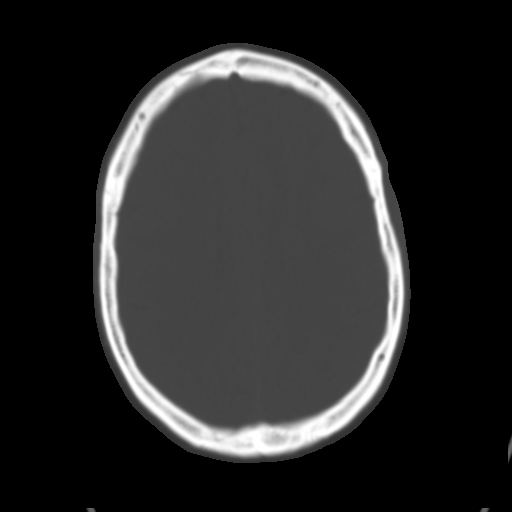
[im 25/33  brain]
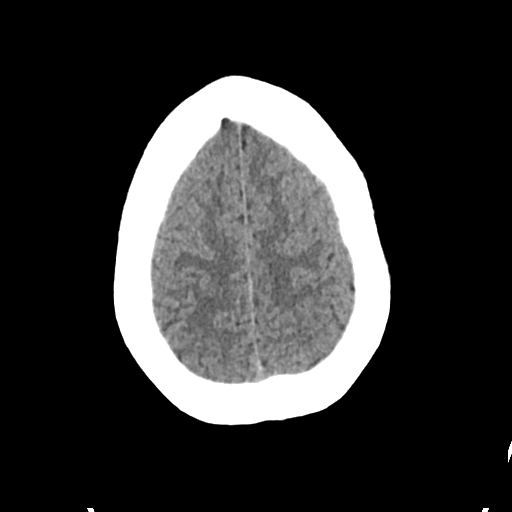
[im 29/33  brain]
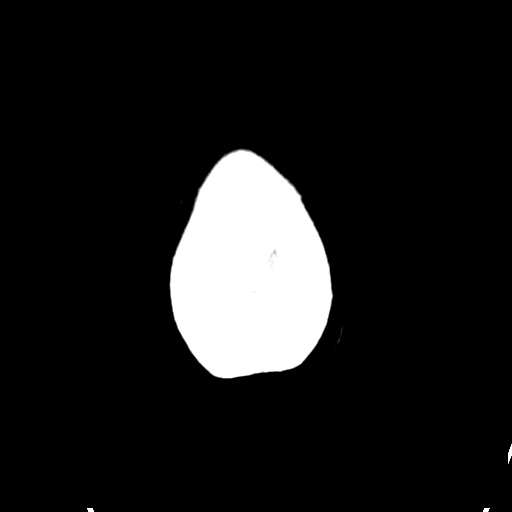

[Series 4: head bone · axial · 0.44mm/px · z∈[-110,-78]mm · 3 of 82 slices shown]
[im 9/82  bone]
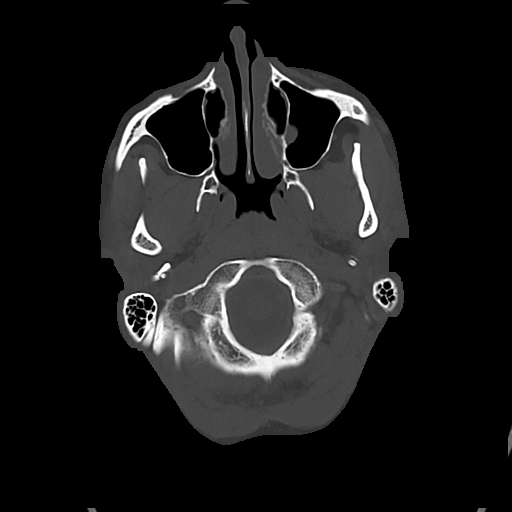
[im 17/82  bone]
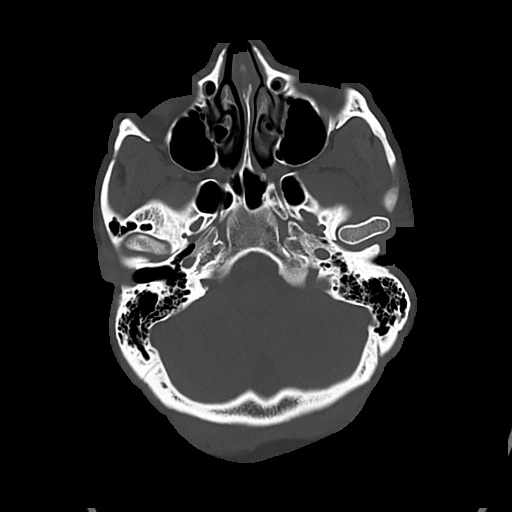
[im 25/82  bone]
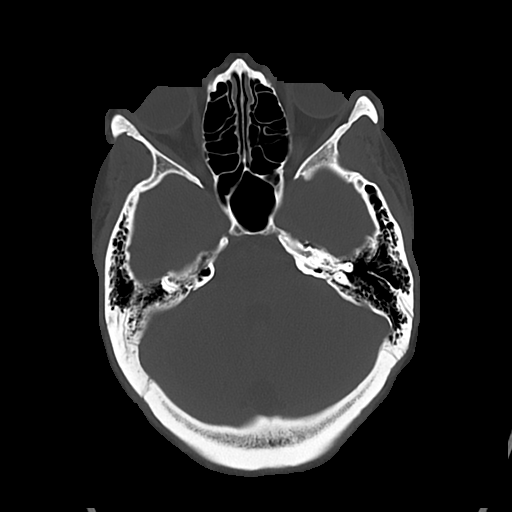

[Series 5: cor soft · coronal · 0.31mm/px · 3 of 73 slices shown]
[im 25/73  brain]
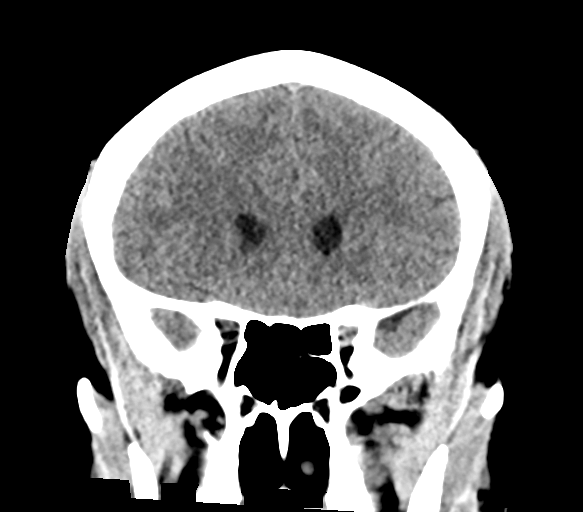
[im 33/73  brain]
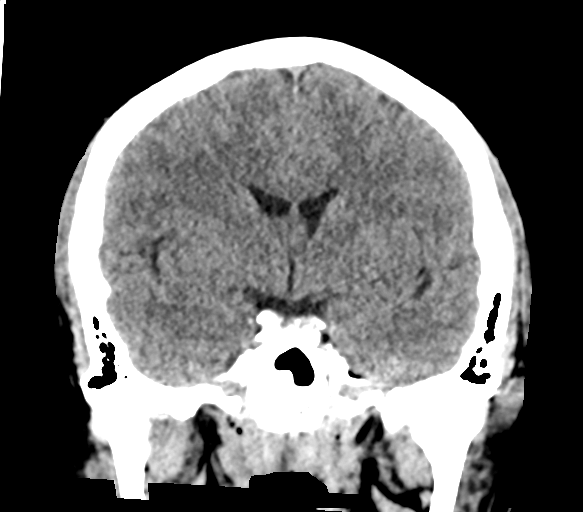
[im 41/73  brain]
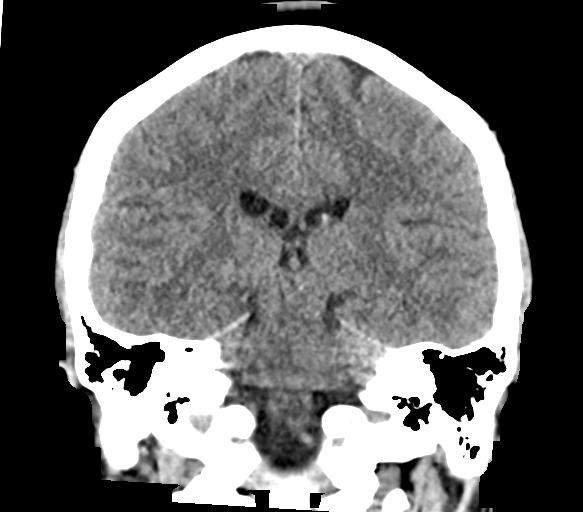

[Series 6: sag soft · sagittal · 0.31mm/px · 3 of 60 slices shown]
[im 20/60  brain]
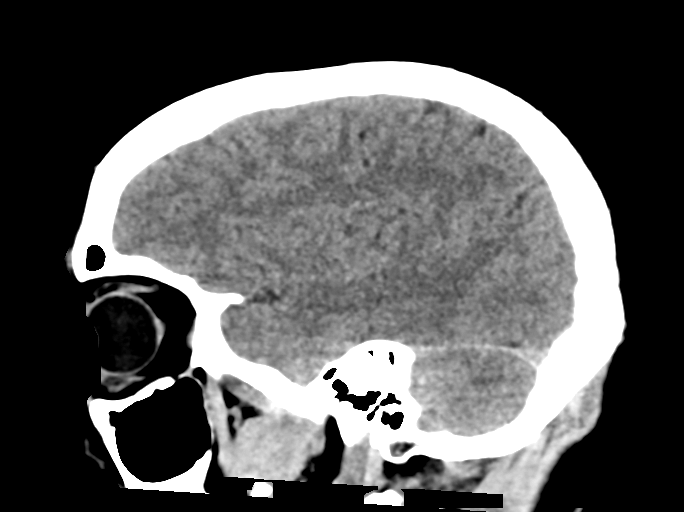
[im 30/60  brain]
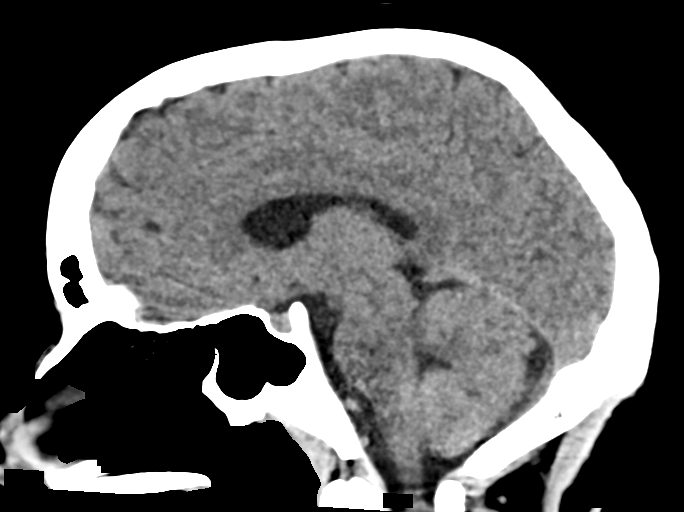
[im 40/60  brain]
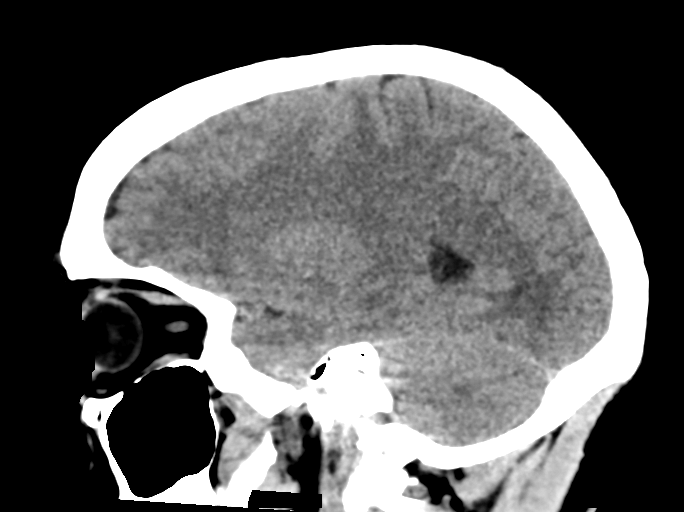

[16 of 47 positions shown; findings below may reference images not displayed]

FINDINGS: Brain: No evidence of acute infarction, hemorrhage, hydrocephalus,
extra-axial collection or mass lesion/mass effect.

Vascular: No hyperdense vessel or unexpected calcification.

Skull: Normal. Negative for fracture or focal lesion.

Sinuses/Orbits: No acute finding.

Other: None
IMPRESSION: Normal noncontrast CT of the brain.

## 2021-08-20 ENCOUNTER — Ambulatory Visit (INDEPENDENT_AMBULATORY_CARE_PROVIDER_SITE_OTHER): Payer: Medicaid Other | Admitting: Primary Care

## 2021-08-20 ENCOUNTER — Encounter (INDEPENDENT_AMBULATORY_CARE_PROVIDER_SITE_OTHER): Payer: Self-pay | Admitting: Primary Care

## 2021-08-20 VITALS — BP 129/76 | HR 96 | Temp 98.1°F | Ht 71.0 in | Wt 261.8 lb

## 2021-08-20 DIAGNOSIS — K5900 Constipation, unspecified: Secondary | ICD-10-CM | POA: Diagnosis not present

## 2021-08-20 MED ORDER — SENNOSIDES 8.6 MG PO TABS: 1.0000 | ORAL_TABLET | Freq: Every day | ORAL | 1 refills | Status: DC

## 2021-08-20 NOTE — Progress Notes (Signed)
    [facility]  Subjective:     [name] is a 26 y.o. female who presents for evaluation of constipation. Onset was 1  year  ago. Patient has been having occasional pellet like stools per week. Defecation has been difficult. Co-Morbid conditions:obesity. Symptoms have gradually worsened. Current Health Habits: Eating fiber? no, Exercise? no, Adequate hydration? yes - 32 oz. Current over the counter/prescription laxative:  none  which has been. Patient has No headache, No chest pain, No abdominal pain - No Nausea, No new weakness tingling or numbness, No Cough - shortness of breath    The following portions of the patient's history were reviewed and updated as appropriate: allergies, current medications, past family history, past medical history, past social history, and past surgical history.  Review of Systems Pertinent items noted in HPI and remainder of comprehensive ROS otherwise negative.   Objective:  BP 129/76   Pulse 96   Temp 98.1 F (36.7 C) (Oral)   Ht 5\' 11"  (1.803 m)   Wt 261 lb 12.8 oz (118.8 kg)   LMP 07/31/2021 (Approximate)   SpO2 97%   BMI 36.51 kg/m      BP 129/76   Pulse 96   Temp 98.1 F (36.7 C) (Oral)   Ht 5\' 11"  (1.803 m)   Wt 261 lb 12.8 oz (118.8 kg)   LMP 07/31/2021 (Approximate)   SpO2 97%   BMI 36.51 kg/m  General appearance: alert, appears stated age, no distress, and moderately obese Head: Normocephalic, without obvious abnormality, atraumatic Eyes: conjunctivae/corneas clear. PERRL, EOM's intact. Fundi benign. Nose: Nares normal. Septum midline. Mucosa normal. No drainage or sinus tenderness. Neck: no adenopathy, no carotid bruit, no JVD, supple, symmetrical, trachea midline, and thyroid not enlarged, symmetric, no tenderness/mass/nodules Back: symmetric, no curvature. ROM normal. No CVA tenderness. Lungs: clear to auscultation bilaterally Heart: regular rate and rhythm, S1, S2 normal, no murmur, click, rub or  gallop Abdomen: soft, non-tender; bowel sounds normal; no masses,  no organomegaly Extremities: extremities normal, atraumatic, no cyanosis or edema Skin: Skin color, texture, turgor normal. No rashes or lesions Lymph nodes: Cervical, supraclavicular, and axillary nodes normal. Neurologic: Alert and oriented X 3, normal strength and tone. Normal symmetric reflexes. Normal coordination and gait Assessment:    Angel Hull was seen today for constipation.  Diagnoses and all orders for this visit:  Constipation, unspecified constipation type Education about constipation causes and treatment discussed. Follow up in  2 months if symptoms do not improve.   Other orders -     senna (SENOKOT) 8.6 MG tablet; Take 1 tablet (8.6 mg total) by mouth daily.  This note has been created with 08/02/2021. Any transcriptional errors are unintentional.   Zella Ball, NP 08/20/2021, 3:50 PM

## 2021-08-20 NOTE — Patient Instructions (Addendum)
Constipation, Adult Constipation is when a person has trouble pooping (having a bowel movement). When you have this condition, you may poop fewer than 3 times a week. Your poop (stool) may also be dry, hard, or bigger than normal. Follow these instructions at home: Eating and drinking  Eat foods that have a lot of fiber, such as: Fresh fruits and vegetables. Whole grains. Beans. Eat less of foods that are low in fiber and high in fat and sugar, such as: [place] fries. Hamburgers. Cookies. Candy. Soda. Drink enough fluid to keep your pee (urine) pale yellow. General instructions Exercise regularly or as told by your doctor. Try to do 150 minutes of exercise each week. Go to the restroom when you feel like you need to poop. Do not hold it in. Take over-the-counter and prescription medicines only as told by your doctor. These include any fiber supplements. When you poop: Do deep breathing while relaxing your lower belly (abdomen). Relax your pelvic floor. The pelvic floor is a group of muscles that support the rectum, bladder, and intestines (as well as the uterus in women). Watch your condition for any changes. Tell your doctor if you notice any. Keep all follow-up visits as told by your doctor. This is important. Contact a doctor if: You have pain that gets worse. You have a fever. You have not pooped for 4 days. You vomit. You are not hungry. You lose weight. You are bleeding from the opening of the butt (anus). You have thin, pencil-like poop. Get help right away if: You have a fever, and your symptoms suddenly get worse. You leak poop or have blood in your poop. Your belly feels hard or bigger than normal (bloated). You have very bad belly pain. You feel dizzy or you faint. Summary Constipation is when a person poops fewer than 3 times a week, has trouble pooping, or has poop that is dry, hard, or bigger than normal. Eat foods that have a lot of fiber. Drink enough fluid  to keep your pee (urine) pale yellow. Take over-the-counter and prescription medicines only as told by your doctor. These include any fiber supplements. This information is not intended to replace advice given to you by your health care provider. Make sure you discuss any questions you have with your health care provider. Document Revised: 11/23/2018 Document Reviewed: 11/23/2018 Elsevier Patient Education  2023 Elsevier Inc. [employer] Content in Foods Fiber is a substance that is found in plant foods, such as fruits, vegetables, whole grains, nuts, seeds, and beans. As part of your treatment and recovery plan, your health care provider may recommend that you eat foods that have specific amounts of dietary fiber. Some conditions may require a high-fiber diet while others may require a low-fiber diet. This sheet gives you information about the dietary fiber content of some common foods. Your health care provider will tell you how much fiber you need in your diet. If you have problems or questions, contact your health care provider or dietitian. What foods are high in fiber?  Fruits Blackberries or raspberries (fresh) --  cup (75 g) has 4 g of fiber. Pear (fresh) -- 1 medium (180 g) has 5.5 g of fiber. Prunes (dried) -- 6 to 8 pieces (57-76 g) has 5 g of fiber. Apple with skin -- 1 medium (182 g) has 4.8 g of fiber. Guava -- 1 cup (128 g) has 8.9 g of fiber. Vegetables Peas (frozen) --  cup (80 g) has 4.4 g of fiber. Potato with  skin (baked) -- 1 medium (173 g) has 4.4 g of fiber. Pumpkin (canned) --  cup (122 g) has 5 g of fiber. Brussels sprouts (cooked) --  cup (78 g) has 4 g of fiber. Sweet potato --  cup mashed (124 g) has 4 g of fiber. Winter squash -- 1 cup cooked (205 g) has 5.7 g of fiber. Grains Bran cereal --  cup (31 g) has 8.6 g of fiber. Bulgur (cooked) --  cup (70 g) has 4 g of fiber. Quinoa (cooked) -- 1 cup (185 g) has 5.2 g of fiber. Popcorn -- 3 cups (375 g) popped has  5.8 g of fiber. Spaghetti, whole wheat -- 1 cup (140 g) has 6 g of fiber. Meats and other proteins Pinto beans (cooked) --  cup (90 g) has 7.7 g of fiber. Lentils (cooked) --  cup (90 g) has 7.8 g of fiber. Kidney beans (canned) --  cup (92.5 g) has 5.7 g of fiber. Soybeans (canned, frozen, or fresh) --  cup (92.5 g) has 5.2 g of fiber. Baked beans, plain or vegetarian (canned) --  cup (130 g) has 5.2 g of fiber. Garbanzo beans or chickpeas (canned) --  cup (90 g) has 6.6 g of fiber. Black beans (cooked) --  cup (86 g) has 7.5 g of fiber. White beans or navy beans (cooked) --  cup (91 g) has 9.3 g of fiber. The items listed above may not be a complete list of foods with high fiber. Actual amounts of fiber may be different depending on processing. Contact a dietitian for more information. What foods are moderate in fiber?  Fruits Banana -- 1 medium (126 g) has 3.2 g of fiber. Melon -- 1 cup (155 g) has 1.4 g of fiber. Orange -- 1 small (154 g) has 3.7 g of fiber. Raisins --  cup (40 g) has 1.8 g of fiber. Applesauce, sweetened --  cup (125 g) has 1.5 g of fiber. Blueberries (fresh) --  cup (75 g) has 1.8 g of fiber. Strawberries (fresh, sliced) -- 1 cup (150 g) has 3 g of fiber. Cherries -- 1 cup (140 g) has 2.9 g of fiber. Vegetables Broccoli (cooked) --  cup (77.5 g) has 2.1 g of fiber. Carrots (cooked) --  cup (77.5 g) has 2.2 g of fiber. Corn (canned or frozen) --  cup (82.5 g) has 2.1 g of fiber. Potatoes, mashed --  cup (105 g) has 1.6 g of fiber. Tomato -- 1 medium (62 g) has 1.5 g of fiber. Green beans (canned) --  cup (83 g) has 2 g of fiber. Squash, winter --  cup (58 g) has 1 g of fiber. Sweet potato, baked -- 1 medium (150 g) has 3 g of fiber. Cauliflower (cooked) -- 1/2 cup (90 g) has 2.3 g of fiber. Grains Long-grain brown rice (cooked) -- 1 cup (196 g) has 3.5 g of fiber. Bagel, plain -- one 4-inch (10 cm) bagel has 2 g of fiber. Instant oatmeal --   cup (120 g) has about 2 g of fiber. Macaroni noodles, enriched (cooked) -- 1 cup (140 g) has 2.5 g of fiber. Multigrain cereal --  cup (15 g) has about 2-4 g of fiber. Whole-wheat bread -- 1 slice (26 g) has 2 g of fiber. Whole-wheat spaghetti noodles --  cup (70 g) has 3.2 g of fiber. Corn tortilla -- one 6-inch (15 cm) tortilla has 1.5 g of fiber. Meats and other proteins Almonds --  cup or 1 oz (28 g) has 3.5 g of fiber. Sunflower seeds in shell --  cup or  oz (11.5 g) has 1.1 g of fiber. Vegetable or soy patty -- 1 patty (70 g) has 3.4 g of fiber. Walnuts --  cup or 1 oz (30 g) has 2 g of fiber. Flax seed -- 1 Tbsp (7 g) has 2.8 g of fiber. The items listed above may not be a complete list of foods that have moderate amounts of fiber. Actual amounts of fiber may be different depending on processing. Contact a dietitian for more information. What foods are low in fiber?  Low-fiber foods contain less than 1 g of fiber per serving. They include: Fruits Fruit juice --  cup or 4 fl oz (118 mL) has 0.5 g of fiber. Vegetables Lettuce -- 1 cup (35 g) has 0.5 g of fiber. Cucumber (slices) --  cup (60 g) has 0.3 g of fiber. Celery -- 1 stalk (40 g) has 0.1 g of fiber. Grains Flour tortilla -- one 6-inch (15 cm) tortilla has 0.5 g of fiber. White rice (cooked) --  cup (81.5 g) has 0.3 g of fiber. Meats and other proteins Egg -- 1 large (50 g) has 0 g of fiber. Meat, poultry, or fish -- 3 oz (85 g) has 0 g of fiber. Dairy Milk -- 1 cup or 8 fl oz (237 mL) has 0 g of fiber. Yogurt -- 1 cup (245 g) has 0 g of fiber. The items listed above may not be a complete list of foods that are low in fiber. Actual amounts of fiber may be different depending on processing. Contact a dietitian for more information. Summary Fiber is a substance that is found in plant foods, such as fruits, vegetables, whole grains, nuts, seeds, and beans. As part of your treatment and recovery plan, your health  care provider may recommend that you eat foods that have specific amounts of dietary fiber. This information is not intended to replace advice given to you by your health care provider. Make sure you discuss any questions you have with your health care provider. Document Revised: 05/11/2019 Document Reviewed: 05/11/2019 Elsevier Patient Education  2023 Elsevier Inc. Preventing Hypertension Hypertension, also called high blood pressure, is when the force of blood pumping through the arteries is too strong. Arteries are blood vessels that carry blood from the heart throughout the body. Often, hypertension does not cause symptoms until blood pressure is very high. It is important to have your blood pressure checked regularly. Diet and lifestyle changes can help you prevent hypertension, and they may make you feel better overall and improve your quality of life. If you already have hypertension, you may control it with diet and lifestyle changes, as well as with medicine. How can this condition affect me? Over time, hypertension can damage the arteries and decrease blood flow to important parts of the body, including the brain, heart, and kidneys. By keeping your blood pressure in a healthy range, you can help prevent complications like heart attack, heart failure, stroke, kidney failure, and vascular dementia. What can increase my risk? An unhealthy diet and a lack of physical activity can make you more likely to develop high blood pressure. Some other risk factors include: Age. The risk increases with age. Having family members who have had high blood pressure. Having certain health conditions, such as thyroid problems. Being overweight or obese. Drinking too much alcohol or caffeine. Having too much fat, sugar, calories, or  salt (sodium) in your diet. Smoking or using illegal drugs. Taking certain medicines, such as antidepressants, decongestants, birth control pills, and NSAIDs, such as  ibuprofen. What actions can I take to prevent or manage this condition? Work with your health care provider to make a hypertension prevention plan that works for you. You may be referred for counseling on a healthy diet and physical activity. Follow your plan and keep all follow-up visits. Diet changes Maintain a healthy diet. This includes: Eating less salt (sodium). Ask your health care provider how much sodium is safe for you to have. The general recommendation is to have less than 1 tsp (2,300 mg) of sodium a day. Do not add salt to your food. Choose low-sodium options when grocery shopping and eating out. Limiting fats in your diet. You can do this by eating low-fat or fat-free dairy products and by eating less red meat. Eating more fruits, vegetables, and whole grains. Make a goal to eat: 1-2 cups of fresh fruits and vegetables each day. 3-4 servings of whole grains each day. Avoiding foods and beverages that have added sugars. Eating fish that contain healthy fats (omega-3 fatty acids), such as mackerel or salmon. If you need help putting together a healthy eating plan, try the DASH diet. This diet is high in fruits, vegetables, and whole grains. It is low in sodium, red meat, and added sugars. DASH stands for Dietary Approaches to Stop Hypertension. Lifestyle changes  Lose weight if you are overweight. Losing just 3-5% of your body weight can help prevent or control hypertension. For example, if your present weight is 200 lb (91 kg), a loss of 3-5% of your weight means losing 6-10 lb (2.7-4.5 kg). Ask your health care provider to help you with a diet and exercise plan to safely lose weight. Get enough exercise. Do at least 150 minutes of moderate-intensity exercise each week. You could do this in short exercise sessions several times a day, or you could do longer exercise sessions a few times a week. For example, you could take a brisk 10-minute walk or bike ride, 3 times a day, for 5 days  a week. Find ways to reduce stress, such as exercising, meditating, listening to music, or taking a yoga class. If you need help reducing stress, ask your health care provider. Do not use any products that contain nicotine or tobacco. These products include cigarettes, chewing tobacco, and vaping devices, such as e-cigarettes. Chemicals in tobacco and nicotine products raise your blood pressure each time you use them. If you need help quitting, ask your health care provider. Learn how to check your blood pressure at home. Make sure that you know your personal target blood pressure, as told by your health care provider. Try to sleep 7-9 hours per night. Alcohol use Do not drink alcohol if: Your health care provider tells you not to drink. You are pregnant, may be pregnant, or are planning to become pregnant. If you drink alcohol: Limit how much you have to: 0-1 drink a day for women. 0-2 drinks a day for men. Know how much alcohol is in your drink. In the [place] one drink equals one 12 oz bottle of beer (355 mL), one 5 oz glass of wine (148 mL), or one 1 oz glass of hard liquor (44 mL). Medicines In addition to diet and lifestyle changes, your health care provider may recommend medicines to help lower your blood pressure. In general: You may need to try a few different medicines to  find what works best for you. You may need to take more than one medicine. Take over-the-counter and prescription medicines only as told by your health care provider. Questions to ask your health care provider What is my blood pressure goal? How can I lower my risk for high blood pressure? How should I monitor my blood pressure at home? Where to find support Your health care provider can help you prevent hypertension and help you keep your blood pressure at a healthy level. Your local hospital or your community may also provide support services and prevention programs. The American Heart Association offers an  online support network at supportnetwork.heart.org Where to find more information Learn more about hypertension from: National Heart, Lung, and Blood Institute: PopSteam.is Centers for Disease Control and Prevention: FootballExhibition.com.br American Academy of Family Physicians: familydoctor.org Learn more about the DASH diet from: National Heart, Lung, and Blood Institute: PopSteam.is Contact a health care provider if: You think you are having a reaction to medicines you have taken. You have recurrent headaches or feel dizzy. You have swelling in your ankles. You have trouble with your vision. Get help right away if: You have sudden, severe chest, back, or abdominal pain or discomfort. You have shortness of breath. You have a sudden, severe headache. These symptoms may be an emergency. Get help right away. Call 911. Do not wait to see if the symptoms will go away. Do not drive yourself to the hospital. Summary Hypertension often does not cause any symptoms until blood pressure is very high. It is important to get your blood pressure checked regularly. Diet and lifestyle changes are important steps in preventing hypertension. By keeping your blood pressure in a healthy range, you may prevent complications like heart attack, heart failure, stroke, and kidney failure. Work with your health care provider to make a hypertension prevention plan that works for you. This information is not intended to replace advice given to you by your health care provider. Make sure you discuss any questions you have with your health care provider. Document Revised: 10/24/2020 Document Reviewed: 10/24/2020 Elsevier Patient Education  2023 [employer].

## 2021-10-26 ENCOUNTER — Encounter (HOSPITAL_COMMUNITY): Payer: Self-pay

## 2021-10-26 ENCOUNTER — Other Ambulatory Visit: Payer: Self-pay

## 2021-10-26 ENCOUNTER — Emergency Department (HOSPITAL_COMMUNITY)
Admission: EM | Admit: 2021-10-26 | Discharge: 2021-10-26 | Discharge status: Against Medical Advice | Payer: Medicaid Other | Attending: Emergency Medicine | Admitting: Emergency Medicine

## 2021-10-26 DIAGNOSIS — Z5321 Procedure and treatment not carried out due to patient leaving prior to being seen by health care provider: Secondary | ICD-10-CM | POA: Insufficient documentation

## 2021-10-26 DIAGNOSIS — R197 Diarrhea, unspecified: Secondary | ICD-10-CM | POA: Diagnosis present

## 2021-10-26 DIAGNOSIS — R0981 Nasal congestion: Secondary | ICD-10-CM | POA: Diagnosis not present

## 2021-10-26 DIAGNOSIS — M7918 Myalgia, other site: Secondary | ICD-10-CM | POA: Diagnosis not present

## 2021-10-26 DIAGNOSIS — Z20822 Contact with and (suspected) exposure to covid-19: Secondary | ICD-10-CM | POA: Diagnosis not present

## 2021-10-26 LAB — URINALYSIS, ROUTINE W REFLEX MICROSCOPIC
Bilirubin Urine: NEGATIVE
Glucose, UA: NEGATIVE mg/dL
Hgb urine dipstick: NEGATIVE
Ketones, ur: NEGATIVE mg/dL
Leukocytes,Ua: NEGATIVE
Nitrite: NEGATIVE
Protein, ur: 30 mg/dL — AB
Specific Gravity, Urine: 1.024 (ref 1.005–1.030)
pH: 5 (ref 5.0–8.0)

## 2021-10-26 LAB — SARS CORONAVIRUS 2 BY RT PCR: SARS Coronavirus 2 by RT PCR: NEGATIVE

## 2021-10-26 LAB — COMPREHENSIVE METABOLIC PANEL
ALT: 19 U/L (ref 0–44)
AST: 22 U/L (ref 15–41)
Albumin: 4.2 g/dL (ref 3.5–5.0)
Alkaline Phosphatase: 78 U/L (ref 38–126)
Anion gap: 10 (ref 5–15)
BUN: 9 mg/dL (ref 6–20)
CO2: 23 mmol/L (ref 22–32)
Calcium: 9.5 mg/dL (ref 8.9–10.3)
Chloride: 105 mmol/L (ref 98–111)
Creatinine, Ser: 0.78 mg/dL (ref 0.44–1.00)
GFR, Estimated: >60 mL/min (ref >60)
Glucose, Bld: 103 mg/dL — ABNORMAL HIGH (ref 70–99)
Potassium: 3.7 mmol/L (ref 3.5–5.1)
Sodium: 138 mmol/L (ref 135–145)
Total Bilirubin: 0.9 mg/dL (ref 0.3–1.2)
Total Protein: 7.7 g/dL (ref 6.5–8.1)

## 2021-10-26 LAB — CBC
HCT: 39.4 % (ref 36.0–46.0)
Hemoglobin: 12.4 g/dL (ref 12.0–15.0)
MCH: 27 pg (ref 26.0–34.0)
MCHC: 31.5 g/dL (ref 30.0–36.0)
MCV: 85.7 fL (ref 80.0–100.0)
Platelets: 308 10*3/uL (ref 150–400)
RBC: 4.6 MIL/uL (ref 3.87–5.11)
RDW: 14.1 % (ref 11.5–15.5)
WBC: 4.7 10*3/uL (ref 4.0–10.5)
nRBC: 0 % (ref 0.0–0.2)

## 2021-10-26 LAB — I-STAT BETA HCG BLOOD, ED (MC, WL, AP ONLY): I-stat hCG, quantitative: <5 m[IU]/mL (ref <5)

## 2021-10-26 LAB — LIPASE, BLOOD: Lipase: 207 U/L — ABNORMAL HIGH (ref 11–51)

## 2021-10-26 NOTE — ED Notes (Signed)
Called pt x3 for vitals, no response. 

## 2021-10-26 NOTE — ED Notes (Signed)
Called pt x2 for vitals, no response. °

## 2021-10-26 NOTE — ED Triage Notes (Signed)
Patient complains of diarrhea, congestion and body aches and thinks sh has covid. Reports symptoms intermittent x 2 weeks. Alert and orinted

## 2021-12-05 ENCOUNTER — Emergency Department (HOSPITAL_COMMUNITY)
Admission: EM | Admit: 2021-12-05 | Discharge: 2021-12-05 | Discharge status: Against Medical Advice | Payer: Medicaid Other | Attending: Emergency Medicine | Admitting: Emergency Medicine

## 2021-12-05 ENCOUNTER — Other Ambulatory Visit (HOSPITAL_COMMUNITY): Payer: Self-pay

## 2021-12-05 ENCOUNTER — Other Ambulatory Visit: Payer: Self-pay

## 2021-12-05 ENCOUNTER — Ambulatory Visit (INDEPENDENT_AMBULATORY_CARE_PROVIDER_SITE_OTHER): Payer: Self-pay | Admitting: *Deleted

## 2021-12-05 DIAGNOSIS — D649 Anemia, unspecified: Secondary | ICD-10-CM | POA: Diagnosis not present

## 2021-12-05 DIAGNOSIS — T7421XA Adult sexual abuse, confirmed, initial encounter: Secondary | ICD-10-CM | POA: Insufficient documentation

## 2021-12-05 DIAGNOSIS — Z0441 Encounter for examination and observation following alleged adult rape: Secondary | ICD-10-CM

## 2021-12-05 DIAGNOSIS — Z5329 Procedure and treatment not carried out because of patient's decision for other reasons: Secondary | ICD-10-CM | POA: Diagnosis not present

## 2021-12-05 DIAGNOSIS — Z5321 Procedure and treatment not carried out due to patient leaving prior to being seen by health care provider: Secondary | ICD-10-CM

## 2021-12-05 LAB — CBC WITH DIFFERENTIAL/PLATELET
Abs Immature Granulocytes: 0.01 10*3/uL (ref 0.00–0.07)
Basophils Absolute: 0.1 10*3/uL (ref 0.0–0.1)
Basophils Relative: 1 %
Eosinophils Absolute: 0.2 10*3/uL (ref 0.0–0.5)
Eosinophils Relative: 4 %
HCT: 36.8 % (ref 36.0–46.0)
Hemoglobin: 11.5 g/dL — ABNORMAL LOW (ref 12.0–15.0)
Immature Granulocytes: 0 %
Lymphocytes Relative: 29 %
Lymphs Abs: 1.6 10*3/uL (ref 0.7–4.0)
MCH: 26.9 pg (ref 26.0–34.0)
MCHC: 31.3 g/dL (ref 30.0–36.0)
MCV: 86 fL (ref 80.0–100.0)
Monocytes Absolute: 0.7 10*3/uL (ref 0.1–1.0)
Monocytes Relative: 13 %
Neutro Abs: 2.9 10*3/uL (ref 1.7–7.7)
Neutrophils Relative %: 53 %
Platelets: 312 10*3/uL (ref 150–400)
RBC: 4.28 MIL/uL (ref 3.87–5.11)
RDW: 14.6 % (ref 11.5–15.5)
WBC: 5.5 10*3/uL (ref 4.0–10.5)
nRBC: 0 % (ref 0.0–0.2)

## 2021-12-05 LAB — COMPREHENSIVE METABOLIC PANEL
ALT: 18 U/L (ref 0–44)
AST: 28 U/L (ref 15–41)
Albumin: 4.2 g/dL (ref 3.5–5.0)
Alkaline Phosphatase: 75 U/L (ref 38–126)
Anion gap: 8 (ref 5–15)
BUN: 9 mg/dL (ref 6–20)
CO2: 23 mmol/L (ref 22–32)
Calcium: 9.1 mg/dL (ref 8.9–10.3)
Chloride: 106 mmol/L (ref 98–111)
Creatinine, Ser: 0.89 mg/dL (ref 0.44–1.00)
GFR, Estimated: >60 mL/min (ref >60)
Glucose, Bld: 108 mg/dL — ABNORMAL HIGH (ref 70–99)
Potassium: 4.6 mmol/L (ref 3.5–5.1)
Sodium: 137 mmol/L (ref 135–145)
Total Bilirubin: 0.5 mg/dL (ref 0.3–1.2)
Total Protein: 7.4 g/dL (ref 6.5–8.1)

## 2021-12-05 LAB — URINALYSIS, ROUTINE W REFLEX MICROSCOPIC
Bilirubin Urine: NEGATIVE
Glucose, UA: NEGATIVE mg/dL
Hgb urine dipstick: NEGATIVE
Ketones, ur: NEGATIVE mg/dL
Leukocytes,Ua: NEGATIVE
Nitrite: NEGATIVE
Protein, ur: NEGATIVE mg/dL
Specific Gravity, Urine: 1.02 (ref 1.005–1.030)
pH: 7.5 (ref 5.0–8.0)

## 2021-12-05 LAB — I-STAT BETA HCG BLOOD, ED (MC, WL, AP ONLY): I-stat hCG, quantitative: <5 m[IU]/mL (ref <5)

## 2021-12-05 LAB — HEPATITIS B SURFACE ANTIGEN: Hepatitis B Surface Ag: NONREACTIVE

## 2021-12-05 LAB — HEPATITIS C ANTIBODY: HCV Ab: NONREACTIVE

## 2021-12-05 LAB — RAPID HIV SCREEN (HIV 1/2 AB+AG)
HIV 1/2 Antibodies: NONREACTIVE
HIV-1 P24 Antigen - HIV24: NONREACTIVE

## 2021-12-05 MED ORDER — ULIPRISTAL ACETATE 30 MG PO TABS
30.0000 mg | ORAL_TABLET | Freq: Once | ORAL | Status: DC
  Filled 2021-12-05: qty 1

## 2021-12-05 MED ORDER — ELVITEG-COBIC-EMTRICIT-TENOFAF 150-150-200-10 MG PREPACK
ORAL_TABLET | ORAL | Status: AC
  Filled 2021-12-05: qty 1

## 2021-12-05 MED ORDER — ELVITEG-COBIC-EMTRICIT-TENOFAF 150-150-200-10 MG PO TABS
1.0000 | ORAL_TABLET | Freq: Every day | ORAL | 0 refills | Status: DC
  Filled 2021-12-05: qty 30, 30d supply, fill #0

## 2021-12-05 MED ORDER — METRONIDAZOLE 500 MG PO TABS
2000.0000 mg | ORAL_TABLET | Freq: Once | ORAL | Status: DC
  Filled 2021-12-05: qty 4

## 2021-12-05 MED ORDER — CEFTRIAXONE SODIUM 1 G IJ SOLR
1.0000 g | Freq: Once | INTRAMUSCULAR | Status: DC
  Filled 2021-12-05 (×2): qty 10

## 2021-12-05 MED ORDER — AZITHROMYCIN 250 MG PO TABS
1000.0000 mg | ORAL_TABLET | Freq: Once | ORAL | Status: DC
  Filled 2021-12-05: qty 4

## 2021-12-05 MED ORDER — ELVITEG-COBIC-EMTRICIT-TENOFAF 150-150-200-10 MG PO TABS: 1.0000 | ORAL_TABLET | Freq: Every day | ORAL | 0 refills | Status: DC

## 2021-12-05 MED ORDER — PROMETHAZINE HCL 25 MG PO TABS: 25.0000 mg | ORAL_TABLET | Freq: Four times a day (QID) | ORAL | Status: DC | PRN

## 2021-12-05 MED ORDER — LIDOCAINE HCL (PF) 1 % IJ SOLN
1.0000 mL | Freq: Once | INTRAMUSCULAR | Status: DC
  Filled 2021-12-05: qty 30

## 2021-12-05 MED ORDER — ELVITEG-COBIC-EMTRICIT-TENOFAF 150-150-200-10 MG PREPACK
1.0000 | ORAL_TABLET | Freq: Once | ORAL | Status: DC
  Filled 2021-12-05: qty 1
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
## 2021-12-05 NOTE — Telephone Encounter (Signed)
Noted  

## 2021-12-05 NOTE — ED Notes (Signed)
[employer] talking with pt. vitals will be updated once [employer] finish

## 2021-12-05 NOTE — ED Notes (Signed)
Patient left AMA.

## 2021-12-05 NOTE — Telephone Encounter (Signed)
Summary: possible std   Pt requesting an appt for today to test for stds  Providers next appt is 12-11  Please assist further       Patient states she is already on EMS- she is getting tested for AIDS- office did not respond quick enough Reason for Disposition  [1] Follow-up call to recent contact AND [2] information only call, no triage required  Answer Assessment - Initial Assessment Questions 1. REASON FOR CALL or QUESTION: "What is your reason for calling today?" or "How can I best help you?" or "What question do you have that I can help answer?"     Called patient back about her concerns- she states she is "on Ambulance" office was not quick enough. She states she is going to get tested for AIDS  Protocols used: Information Only Call - No Triage-A-AH

## 2021-12-05 NOTE — ED Notes (Signed)
Patient refused to have vitals taken she stated, "I don't need them taken I'm about to go; take my IV out so I can leave." Nurse removed IV and asked patient to stay in room to speak with provider before leaving, patient agreed.
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
## 2021-12-06 ENCOUNTER — Other Ambulatory Visit (HOSPITAL_COMMUNITY): Payer: Self-pay

## 2021-12-06 LAB — RPR: RPR Ser Ql: NONREACTIVE
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
## 2021-12-15 ENCOUNTER — Other Ambulatory Visit (HOSPITAL_COMMUNITY)
Admission: RE | Admit: 2021-12-15 | Discharge: 2021-12-15 | Disposition: A | Discharge status: Home / Self Care | Payer: Medicaid Other | Source: Ambulatory Visit | Attending: Primary Care | Admitting: Primary Care

## 2021-12-15 ENCOUNTER — Encounter (INDEPENDENT_AMBULATORY_CARE_PROVIDER_SITE_OTHER): Payer: Self-pay | Admitting: Primary Care

## 2021-12-15 ENCOUNTER — Ambulatory Visit (INDEPENDENT_AMBULATORY_CARE_PROVIDER_SITE_OTHER): Payer: Medicaid Other | Admitting: Primary Care

## 2021-12-15 VITALS — BP 130/78 | HR 82 | Resp 16 | Ht 71.0 in | Wt 258.6 lb

## 2021-12-15 DIAGNOSIS — Z202 Contact with and (suspected) exposure to infections with a predominantly sexual mode of transmission: Secondary | ICD-10-CM | POA: Insufficient documentation

## 2021-12-15 DIAGNOSIS — R030 Elevated blood-pressure reading, without diagnosis of hypertension: Secondary | ICD-10-CM

## 2021-12-15 DIAGNOSIS — L732 Hidradenitis suppurativa: Secondary | ICD-10-CM | POA: Diagnosis not present

## 2021-12-15 DIAGNOSIS — Z124 Encounter for screening for malignant neoplasm of cervix: Secondary | ICD-10-CM
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
## 2021-12-15 NOTE — Patient Instructions (Signed)
Hidradenitis Suppurativa Hidradenitis suppurativa is a long-term (chronic) skin disease. It is similar to a severe form of acne, but it affects areas of the body where acne would be unusual, especially areas of the body where skin rubs against skin and becomes moist. These include: Underarms. Groin. Genital area. Buttocks. Upper thighs. Breasts. Hidradenitis suppurativa may start out as small lumps or pimples caused by blocked skin pores, sweat glands, or hair follicles. Pimples may develop into deep sores that break open (rupture) and drain pus. Over time, affected areas of skin may thicken and become scarred. This condition is rare and does not spread from person to person (non-contagious). What are the causes? The exact cause of this condition is not known. It may be related to: Female and female hormones. An overactive disease-fighting system (immune system). The immune system may over-react to blocked hair follicles or sweat glands and cause swelling and pus-filled sores. What increases the risk? You are more likely to develop this condition if you: Are female. Are 11-55 years old. Have a family history of hidradenitis suppurativa. Have a personal history of acne. Are overweight. Smoke. Take the medicine lithium. What are the signs or symptoms? The first symptoms are usually painful bumps in the skin, similar to pimples. The condition may get worse over time (progress), or it may only cause mild symptoms. If the disease progresses, symptoms may include: Skin bumps getting bigger and growing deeper into the skin. Bumps rupturing and draining pus. Itchy, infected skin. Skin getting thicker and scarred. Tunnels under the skin (fistulas) where pus drains from a bump. Pain during daily activities, such as pain during walking if your groin area is affected. Emotional problems, such as stress or depression. This condition may affect your appearance and your ability or willingness to wear  certain clothes or do certain activities. How is this diagnosed? This condition is diagnosed by a health care provider who specializes in skin conditions (dermatologist). You may be diagnosed based on: Your symptoms and medical history. A physical exam. Testing a pus sample for infection. Blood tests. How is this treated? Your treatment will depend on how severe your symptoms are. The same treatment will not work for everybody with this condition. You may need to try several treatments to find what works best for you. Treatment may include: Cleaning and bandaging (dressing) your wounds as needed. Lifestyle changes, such as new skin care routines. Taking medicines, such as: Antibiotics. Acne medicines. Medicines to reduce the activity of the immune system. A diabetes medicine (metformin). Birth control pills, for women. Steroids to reduce swelling and pain. Working with a mental health care provider, if you experience emotional distress due to this condition. If you have severe symptoms that do not get better with medicine, you may need surgery. Surgery may involve: Using a laser to clear the skin and remove hair follicles. Opening and draining deep sores. Removing the areas of skin that are diseased and scarred. Follow these instructions at home: Medicines  Take over-the-counter and prescription medicines only as told by your health care provider. If you were prescribed antibiotics, take them as told by your health care provider. Do not stop using the antibiotic even if your condition improves. Skin care If you have open wounds, cover them with a clean dressing as told by your health care provider. Keep wounds clean by washing them gently with soap and water when you bathe. Do not shave the areas where you get hidradenitis suppurativa. Wear loose-fitting clothes. Try to avoid   getting overheated or sweaty. If you get sweaty or wet, change into clean, dry clothes as soon as you can. To  help relieve pain and itchiness, cover sore areas with a warm, clean washcloth (warm compress) for 5-10 minutes as often as needed. Your healthcare provider may recommend an antiperspirant deodorant that may be gentle on your skin. A daily antiseptic wash to cleanse affected areas may be suggested by your healthcare provider. General instructions Learn as much as you can about your disease so that you have an active role in your treatment. Work closely with your health care provider to find treatments that work for you. If you are overweight, work with your health care provider to lose weight as recommended. Do not use any products that contain nicotine or tobacco. These products include cigarettes, chewing tobacco, and vaping devices, such as e-cigarettes. If you need help quitting, ask your health care provider. If you struggle with living with this condition, talk with your health care provider or work with a mental health care provider as recommended. Keep all follow-up visits. Where to find more information Hidradenitis Suppurativa Foundation, Inc.: www.hs-foundation.org American Academy of Dermatology: www.aad.org Contact a health care provider if: You have a flare-up of hidradenitis suppurativa. You have a fever or chills. You have trouble controlling your symptoms at home. You have trouble doing your daily activities because of your symptoms. You have trouble dealing with emotional problems related to your condition. Summary Hidradenitis suppurativa is a long-term (chronic) skin disease. It is similar to a severe form of acne, but it affects areas of the body where acne would be unusual. The first symptoms are usually painful bumps in the skin, similar to pimples. The condition may only cause mild symptoms, or it may get worse over time (progress). If you have open wounds, cover them with a clean dressing as told by your health care provider. Keep wounds clean by washing them gently with  soap and water when you bathe. Besides skin care, treatment may include medicines, laser treatment, and surgery. This information is not intended to replace advice given to you by your health care provider. Make sure you discuss any questions you have with your health care provider. Document Revised: 02/26/2021 Document Reviewed: 02/26/2021 Elsevier Patient Education  2023 [employer].  

## 2021-12-17 LAB — CYTOLOGY - PAP
Adequacy: ABSENT
Chlamydia: NEGATIVE
Comment: NEGATIVE
Comment: NEGATIVE
Comment: NORMAL
Diagnosis: NEGATIVE
Neisseria Gonorrhea: NEGATIVE
Trichomonas: NEGATIVE

## 2021-12-17 LAB — CERVICOVAGINAL ANCILLARY ONLY
Bacterial Vaginitis (gardnerella): POSITIVE — AB
Candida Glabrata: NEGATIVE
Candida Vaginitis: NEGATIVE
Chlamydia: NEGATIVE
Comment: NEGATIVE
Comment: NEGATIVE
Comment: NEGATIVE
Comment: NEGATIVE
Comment: NEGATIVE
Comment: NORMAL
Neisseria Gonorrhea: NEGATIVE
Trichomonas: NEGATIVE

## 2021-12-18 ENCOUNTER — Other Ambulatory Visit (INDEPENDENT_AMBULATORY_CARE_PROVIDER_SITE_OTHER): Payer: Self-pay | Admitting: Primary Care

## 2021-12-18 DIAGNOSIS — B9689 Other specified bacterial agents as the cause of diseases classified elsewhere: Secondary | ICD-10-CM

## 2021-12-18 MED ORDER — METRONIDAZOLE 500 MG PO TABS: 500.0000 mg | ORAL_TABLET | Freq: Two times a day (BID) | ORAL | 0 refills | Status: DC

## 2022-01-01 ENCOUNTER — Other Ambulatory Visit (HOSPITAL_COMMUNITY)
Admission: RE | Admit: 2022-01-01 | Discharge: 2022-01-01 | Disposition: A | Discharge status: Home / Self Care | Payer: Medicaid Other | Source: Ambulatory Visit | Attending: Primary Care | Admitting: Primary Care

## 2022-01-01 ENCOUNTER — Ambulatory Visit (INDEPENDENT_AMBULATORY_CARE_PROVIDER_SITE_OTHER): Payer: Medicaid Other | Admitting: Primary Care

## 2022-01-01 ENCOUNTER — Encounter (INDEPENDENT_AMBULATORY_CARE_PROVIDER_SITE_OTHER): Payer: Self-pay | Admitting: Primary Care

## 2022-01-01 VITALS — BP 134/82 | HR 91 | Resp 16 | Wt 261.0 lb

## 2022-01-01 DIAGNOSIS — N760 Acute vaginitis: Secondary | ICD-10-CM

## 2022-01-01 DIAGNOSIS — B9689 Other specified bacterial agents as the cause of diseases classified elsewhere: Secondary | ICD-10-CM | POA: Diagnosis not present

## 2022-01-01 MED ORDER — FLUCONAZOLE 150 MG PO TABS: 150.0000 mg | ORAL_TABLET | Freq: Once | ORAL | 1 refills | Status: AC

## 2022-01-01 MED ORDER — SENNOSIDES 8.6 MG PO TABS: 1.0000 | ORAL_TABLET | Freq: Every day | ORAL | 1 refills | Status: DC

## 2022-01-04 NOTE — Progress Notes (Signed)
    [facility]    Subjective:    [name] is a 26 y.o. who presents for evaluation of an abnormal vaginal discharge.  She was treated and completed the medication for bacterial vaginosis.  Has some concerns that she may still have BV symptoms vaginal discharge .she will do cervical ancillary.  She also voices problems with constipation. The following portions of the patient's history were reviewed and updated as appropriate: allergies, current medications, past family history, past medical history, past social history, past surgical history, and problem list.   Review of Systems Pertinent items are noted in HPI.    Objective:  Blood Pressure 134/82   Pulse 91   Respiration 16   Weight 261 lb (118.4 kg)   Last Menstrual Period 11/19/2021   Oxygen Saturation 99%   Body Mass Index 36.40 kg/m    Blood Pressure 134/82   Pulse 91   Respiration 16   Weight 261 lb (118.4 kg)   Last Menstrual Period 11/19/2021   Oxygen Saturation 99%   Body Mass Index 36.40 kg/m  General appearance: cooperative and appears older than stated age Normal sinus rhythm Auscultation AP and lateral clear  Abdomen soft nontender Alert and oriented   Assessment:  Angel Hull was seen today for vaginal concerns .  Diagnoses and all orders for this visit:  BV (bacterial vaginosis) -     Cervicovaginal ancillary only  Other orders -     senna (SENOKOT) 8.6 MG tablet; Take 1 tablet (8.6 mg total) by mouth daily. -     fluconazole (DIFLUCAN) 150 MG tablet; Take 1 tablet (150 mg total) by mouth once for 1 dose.    This note has been created with Education officer, environmental. Any transcriptional errors are unintentional.   Grayce Sessions, NP 01/04/2022, 10:52 PM

## 2022-01-05 ENCOUNTER — Other Ambulatory Visit (INDEPENDENT_AMBULATORY_CARE_PROVIDER_SITE_OTHER): Payer: Self-pay | Admitting: Primary Care

## 2022-01-05 LAB — CERVICOVAGINAL ANCILLARY ONLY
Bacterial Vaginitis (gardnerella): NEGATIVE
Candida Glabrata: NEGATIVE
Candida Vaginitis: POSITIVE — AB
Chlamydia: NEGATIVE
Comment: NEGATIVE
Comment: NEGATIVE
Comment: NEGATIVE
Comment: NEGATIVE
Comment: NEGATIVE
Comment: NORMAL
Neisseria Gonorrhea: NEGATIVE
Trichomonas: NEGATIVE

## 2022-01-05 MED ORDER — FLUCONAZOLE 150 MG PO TABS: 150.0000 mg | ORAL_TABLET | Freq: Every day | ORAL | 1 refills | Status: DC

## 2022-01-10 ENCOUNTER — Other Ambulatory Visit: Payer: Self-pay

## 2022-01-10 ENCOUNTER — Encounter (HOSPITAL_COMMUNITY): Payer: Self-pay | Admitting: Emergency Medicine

## 2022-01-10 ENCOUNTER — Emergency Department (HOSPITAL_COMMUNITY)
Admission: EM | Admit: 2022-01-10 | Discharge: 2022-01-10 | Discharge status: Against Medical Advice | Payer: Medicaid Other | Attending: Emergency Medicine | Admitting: Emergency Medicine

## 2022-01-10 ENCOUNTER — Emergency Department (HOSPITAL_COMMUNITY): Payer: Medicaid Other

## 2022-01-10 DIAGNOSIS — Y998 Other external cause status: Secondary | ICD-10-CM | POA: Diagnosis not present

## 2022-01-10 DIAGNOSIS — W5501XA Bitten by cat, initial encounter: Secondary | ICD-10-CM | POA: Diagnosis not present

## 2022-01-10 DIAGNOSIS — Z5321 Procedure and treatment not carried out due to patient leaving prior to being seen by health care provider: Secondary | ICD-10-CM | POA: Insufficient documentation

## 2022-01-10 DIAGNOSIS — Y9289 Other specified places as the place of occurrence of the external cause: Secondary | ICD-10-CM | POA: Diagnosis not present

## 2022-01-10 DIAGNOSIS — S61250A Open bite of right index finger without damage to nail, initial encounter: Secondary | ICD-10-CM | POA: Diagnosis not present

## 2022-01-10 DIAGNOSIS — Y9389 Activity, other specified: Secondary | ICD-10-CM | POA: Insufficient documentation

## 2022-01-10 NOTE — ED Triage Notes (Addendum)
While trying to break up a fight between her cat and a couple dogs her R index finger. Over night redness and swelling developed. Unknown if cat has had rabies vaccine.

## 2022-01-10 NOTE — ED Provider Triage Note (Signed)
Emergency Medicine Provider Triage Evaluation Note  Angel Hull , a 26 y.o. female  was evaluated in triage.  Pt complains of cat bite PTA. It is her cat and she is unsure of the vaccine status.  Notes that her cat was in a fight with 2 dogs and she was trying to break her cat away from the dog and her cat bit her to her right index finger.  Her cat is not up-to-date with her vaccines.  Notes that animal control is trying to look for the cat at this time.  Denies fever.  Has associated redness and swelling noted to the right hand.  Review of Systems  Positive: Negative:   Physical Exam  BP (!) 156/83   Pulse (!) 116   Temp 99.2 F (37.3 C)   Resp 20   SpO2 96%  Gen:   Awake, no distress   Resp:  Normal effort  MSK:   Moves extremities without difficulty  Other:  Erythema and swelling along with tenderness to palpation noted to the dorsum of the right hand at the first second and third metacarpal space.  Puncture wound noted to the middle phalanx of the right index finger with pus noted to the area.  Tenderness to palpation noted diffusely throughout hand.  Radial pulse intact.  Normal flexion and extension of right hand.  Capillary refill less than 2 seconds.  Medical Decision Making  Medically screening exam initiated at 11:16 AM.  Appropriate orders placed.  Roselee Tayloe was informed that the remainder of the evaluation will be completed by another provider, this initial triage assessment does not replace that evaluation, and the importance of remaining in the ED until their evaluation is complete.  Per patient chart review: Patient's tetanus was in 03/23/2018.    Willa Brocks A, PA-C 01/10/22 1116

## 2022-01-10 NOTE — ED Notes (Signed)
Called for vitals. No response 
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
## 2022-01-27 ENCOUNTER — Ambulatory Visit (INDEPENDENT_AMBULATORY_CARE_PROVIDER_SITE_OTHER): Payer: Medicaid Other | Admitting: Primary Care

## 2022-02-02 ENCOUNTER — Ambulatory Visit (INDEPENDENT_AMBULATORY_CARE_PROVIDER_SITE_OTHER): Payer: Self-pay | Admitting: *Deleted

## 2022-02-02 NOTE — Telephone Encounter (Signed)
Reason for Disposition . Simple fainting is a chronic symptom (has occurred multiple times)  Answer Assessment - Initial Assessment Questions 1. ONSET: "How long were you unconscious?" (minutes) "When did it happen?"     Every time I get hot I pass out.    For a couple of years this has been going on.   I can't cook because I pass out from the heat.  I need to get diagnosed.   I was told one time I have syncope. 2. CONTENT: "What happened during period of unconsciousness?" (e.g., seizure activity)      Pass out until I get cool again.  I put ice on my face. 3. MENTAL STATUS: "Alert and oriented now?" (oriented x 3 = name, month, location)      Alert and oriented now.   Passed out earlier today. 4. TRIGGER: "What do you think caused the fainting?" "What were you doing just before you fainted?"  (e.g., exercise, sudden standing up, prolonged standing)     Every time I get hot I pass out until I get cool again for the last 2 yrs. 5. RECURRENT SYMPTOM: "Have you ever passed out before?" If Yes, ask: "When was the last time?" and "What happened that time?"      Yes for the last 2 yrs.  At this point she just wanted an appt. So appt made with Juluis Mire, NP. 6. INJURY: "Did you sustain any injury during the fall?"       7. CARDIAC SYMPTOMS: "Have you had any of the following symptoms: chest pain, difficulty breathing, palpitations?"      8. NEUROLOGIC SYMPTOMS: "Have you had any of the following symptoms: headache, numbness, vertigo, weakness?"      9. GI SYMPTOMS: "Have you had any of the following symptoms: abdomen pain, vomiting, diarrhea, blood in stools?"      10. OTHER SYMPTOMS: "Do you have any other symptoms?"        11. PREGNANCY: "Is there any chance you are pregnant?" "When was your last menstrual period?"  Protocols used: Fainting-A-AH

## 2022-02-02 NOTE — Telephone Encounter (Signed)
  Chief Complaint: Passing out every time she gets hot for the last 2 yrs. Symptoms: passed out today when she got hot.   Feels fine now. Frequency: Every time she gets hot. Pertinent Negatives: Patient denies knowing why.   Told she had syncope. Disposition: [] ED /[] Urgent Care (no appt availability in office) / [x] Appointment(In office/virtual)/ []  [place] [facility] [facility]/ [] Home Care/ [] Refused Recommended Disposition /[] [place] [facility]/ []  Follow-up with PCP Additional Notes: Appt made with Juluis Mire, NP for 02/03/2022 at 4:10 PM.

## 2022-02-03 ENCOUNTER — Other Ambulatory Visit (HOSPITAL_COMMUNITY)
Admission: RE | Admit: 2022-02-03 | Discharge: 2022-02-03 | Disposition: A | Discharge status: Home / Self Care | Payer: Medicaid Other | Source: Ambulatory Visit | Attending: Primary Care | Admitting: Primary Care

## 2022-02-03 ENCOUNTER — Encounter (INDEPENDENT_AMBULATORY_CARE_PROVIDER_SITE_OTHER): Payer: Self-pay | Admitting: Primary Care

## 2022-02-03 ENCOUNTER — Ambulatory Visit (INDEPENDENT_AMBULATORY_CARE_PROVIDER_SITE_OTHER): Payer: Medicaid Other | Admitting: Primary Care

## 2022-02-03 VITALS — BP 142/86 | HR 91 | Resp 16 | Wt 267.0 lb

## 2022-02-03 DIAGNOSIS — N760 Acute vaginitis: Secondary | ICD-10-CM | POA: Diagnosis not present

## 2022-02-03 DIAGNOSIS — B9689 Other specified bacterial agents as the cause of diseases classified elsewhere: Secondary | ICD-10-CM | POA: Diagnosis not present

## 2022-02-03 DIAGNOSIS — R55 Syncope and collapse: Secondary | ICD-10-CM | POA: Diagnosis not present

## 2022-02-03 DIAGNOSIS — Z202 Contact with and (suspected) exposure to infections with a predominantly sexual mode of transmission: Secondary | ICD-10-CM
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
## 2022-02-05 LAB — CERVICOVAGINAL ANCILLARY ONLY
Bacterial Vaginitis (gardnerella): POSITIVE — AB
Candida Glabrata: NEGATIVE
Candida Vaginitis: NEGATIVE
Chlamydia: NEGATIVE
Comment: NEGATIVE
Comment: NEGATIVE
Comment: NEGATIVE
Comment: NEGATIVE
Comment: NEGATIVE
Comment: NORMAL
Neisseria Gonorrhea: NEGATIVE
Trichomonas: NEGATIVE

## 2022-02-08 MED ORDER — FLUCONAZOLE 150 MG PO TABS: 150.0000 mg | ORAL_TABLET | Freq: Every day | ORAL | 1 refills | Status: DC

## 2022-02-08 MED ORDER — METRONIDAZOLE 500 MG PO TABS: 500.0000 mg | ORAL_TABLET | Freq: Two times a day (BID) | ORAL | 0 refills | Status: DC

## 2022-02-24 ENCOUNTER — Other Ambulatory Visit (INDEPENDENT_AMBULATORY_CARE_PROVIDER_SITE_OTHER): Payer: Self-pay | Admitting: Primary Care

## 2022-02-24 DIAGNOSIS — R55 Syncope and collapse: Secondary | ICD-10-CM

## 2022-02-24 DIAGNOSIS — R002 Palpitations: Secondary | ICD-10-CM

## 2022-03-05 ENCOUNTER — Encounter: Payer: Self-pay | Admitting: Cardiovascular Disease

## 2022-03-05 ENCOUNTER — Ambulatory Visit: Payer: Medicaid Other | Attending: Cardiovascular Disease | Admitting: Cardiovascular Disease

## 2022-03-05 VITALS — BP 152/82 | HR 97 | Ht 71.0 in | Wt 248.4 lb

## 2022-03-05 DIAGNOSIS — T671XXD Heat syncope, subsequent encounter: Secondary | ICD-10-CM

## 2022-03-05 DIAGNOSIS — R002 Palpitations: Secondary | ICD-10-CM

## 2022-03-05 NOTE — Progress Notes (Signed)
61     03/05/2022 Angel Hull   Jan 08, 1996  [id number]  Primary Physician Kerin Perna, NP Primary Cardiologist: Lorretta Harp MD Lupe Carney, [place]  HPI:  Angel Hull is a 27 y.o. moderately overweight single African-American female mother of 1 child (currently 91 month pregnant) who was referred by Juluis Mire, NP for evaluation of syncope.  She currently works walking dogs and doing security work.  She has no cardiovascular risk factors.  She has gained 45 pounds in the last 4 years.  She saw Dr. Golden Hurter 03/29/2018 because of this.  2D echo was normal.  She saw Dr. Rayann Heman who placed a loop recorder.  There have been no arrhythmias noted.  She says that she gets dizzy and comes close to passing out when she gets hot [place] is exposed to heat but not so much when at school out.   Current Meds  Medication Sig   fluconazole (DIFLUCAN) 150 MG tablet Take 1 tablet (150 mg total) by mouth daily.   metroNIDAZOLE (FLAGYL) 500 MG tablet Take 1 tablet (500 mg total) by mouth 2 (two) times daily.   senna (SENOKOT) 8.6 MG tablet Take 1 tablet (8.6 mg total) by mouth daily.     No Known Allergies  Social History   Socioeconomic History   Marital status: Single    Spouse name: Not on file   Number of children: Not on file   Years of education: Not on file   Highest education level: Not on file  Occupational History   Not on file  Tobacco Use   Smoking status: Never   Smokeless tobacco: Never  Vaping Use   Vaping Use: Never used  Substance and Sexual Activity   Alcohol use: No   Drug use: No   Sexual activity: Not Currently  Other Topics Concern   Not on file  Social History Narrative   Lives in [place] with mother, brother, and daughter      She is in school at [employer].  She plans to transfer to [employer].   Social Determinants of Health   Financial Resource Strain: Not on file  Food Insecurity: Not on file  Transportation Needs: Not on file  Physical  Activity: Not on file  Stress: Not on file  Social Connections: Not on file  Intimate Partner Violence: Not on file     Review of Systems: General: negative for chills, fever, night sweats or weight changes.  Cardiovascular: negative for chest pain, dyspnea on exertion, edema, orthopnea, palpitations, paroxysmal nocturnal dyspnea or shortness of breath Dermatological: negative for rash Respiratory: negative for cough or wheezing Urologic: negative for hematuria Abdominal: negative for nausea, vomiting, diarrhea, bright red blood per rectum, melena, or hematemesis Neurologic: negative for visual changes, syncope, or dizziness All other systems reviewed and are otherwise negative except as noted above.    Blood pressure (!) 152/82, pulse 97, height 5' 11"$  (1.803 m), weight 248 lb 6.4 oz (112.7 kg), SpO2 99 %.  General appearance: alert and no distress Neck: no adenopathy, no carotid bruit, no JVD, supple, symmetrical, trachea midline, and thyroid not enlarged, symmetric, no tenderness/mass/nodules Lungs: clear to auscultation bilaterally Heart: regular rate and rhythm, S1, S2 normal, no murmur, click, rub or gallop Extremities: extremities normal, atraumatic, no cyanosis or edema Pulses: 2+ and symmetric Skin: Skin color, texture, turgor normal. No rashes or lesions Neurologic: Grossly normal  EKG sinus rhythm at 97 without ST or T wave changes.  Personally reviewed this EKG.  ASSESSMENT AND PLAN:   Syncope Ms. Grenier was referred by episodes of syncope.  She has been evaluated by Dr. Radford Pax and already back in 2020.  She has a loop recorder implanted that did not demonstrate any arrhythmias.  Her 2D echo was normal.  She says her lightheadedness occurs mostly when she is exposed to heat.  She has no other medical problems.  At this point, I do not think her symptoms are cardiovascularly mediated.     Lorretta Harp MD FACP,FACC,FAHA, [facility] 03/05/2022 3:36 PM

## 2022-03-05 NOTE — Patient Instructions (Signed)
Medication Instructions:  Your physician recommends that you continue on your current medications as directed. Please refer to the Current Medication list given to you today.  *If you need a refill on your cardiac medications before your next appointment, please call your pharmacy*   Follow-Up: At [facility], you and your health needs are our priority.  As part of our continuing mission to provide you with exceptional heart care, we have created designated Provider Care Teams.  These Care Teams include your primary Cardiologist (physician) and Advanced Practice Providers (APPs -  Physician Assistants and Nurse Practitioners) who all work together to provide you with the care you need, when you need it.  We recommend signing up for the patient portal called "MyChart".  Sign up information is provided on this After Visit Summary.  MyChart is used to connect with patients for Virtual Visits (Telemedicine).  Patients are able to view lab/test results, encounter notes, upcoming appointments, etc.  Non-urgent messages can be sent to your provider as well.   To learn more about what you can do with MyChart, go to [email or web address].    Your next appointment:   We will see you on an as needed basis.  Provider:   Quay Burow, MD

## 2022-03-05 NOTE — Assessment & Plan Note (Signed)
Ms. Hanlan was referred by episodes of syncope.  She has been evaluated by Dr. Radford Pax and already back in 2020.  She has a loop recorder implanted that did not demonstrate any arrhythmias.  Her 2D echo was normal.  She says her lightheadedness occurs mostly when she is exposed to heat.  She has no other medical problems.  At this point, I do not think her symptoms are cardiovascularly mediated.

## 2022-05-05 ENCOUNTER — Ambulatory Visit (INDEPENDENT_AMBULATORY_CARE_PROVIDER_SITE_OTHER): Payer: 59 | Admitting: Family Medicine

## 2022-05-05 ENCOUNTER — Other Ambulatory Visit: Payer: Self-pay

## 2022-05-05 ENCOUNTER — Other Ambulatory Visit (HOSPITAL_COMMUNITY)
Admission: RE | Admit: 2022-05-05 | Discharge: 2022-05-05 | Disposition: A | Discharge status: Home / Self Care | Payer: 59 | Source: Ambulatory Visit | Attending: Family Medicine | Admitting: Family Medicine

## 2022-05-05 VITALS — BP 118/70 | HR 87 | Wt 239.3 lb

## 2022-05-05 DIAGNOSIS — Z3201 Encounter for pregnancy test, result positive: Secondary | ICD-10-CM | POA: Diagnosis not present

## 2022-05-05 DIAGNOSIS — O26899 Other specified pregnancy related conditions, unspecified trimester: Secondary | ICD-10-CM | POA: Diagnosis present

## 2022-05-05 DIAGNOSIS — Z3A00 Weeks of gestation of pregnancy not specified: Secondary | ICD-10-CM | POA: Insufficient documentation

## 2022-05-05 DIAGNOSIS — O0993 Supervision of high risk pregnancy, unspecified, third trimester: Secondary | ICD-10-CM | POA: Insufficient documentation

## 2022-05-05 DIAGNOSIS — R109 Unspecified abdominal pain: Secondary | ICD-10-CM | POA: Diagnosis not present

## 2022-05-05 DIAGNOSIS — Z3A19 19 weeks gestation of pregnancy: Secondary | ICD-10-CM

## 2022-05-05 DIAGNOSIS — Z3492 Encounter for supervision of normal pregnancy, unspecified, second trimester: Secondary | ICD-10-CM | POA: Insufficient documentation

## 2022-05-05 DIAGNOSIS — O26892 Other specified pregnancy related conditions, second trimester: Secondary | ICD-10-CM

## 2022-05-05 DIAGNOSIS — Z3490 Encounter for supervision of normal pregnancy, unspecified, unspecified trimester: Secondary | ICD-10-CM | POA: Insufficient documentation

## 2022-05-05 DIAGNOSIS — Z3200 Encounter for pregnancy test, result unknown: Secondary | ICD-10-CM

## 2022-05-05 LAB — POCT PREGNANCY, URINE: Preg Test, Ur: POSITIVE — AB

## 2022-05-05 MED ORDER — VITAFOL GUMMIES 3.33-0.333-34.8 MG PO CHEW: 3.0000 | CHEWABLE_TABLET | Freq: Every day | ORAL | 11 refills | Status: AC

## 2022-05-05 MED ORDER — ASPIRIN 81 MG PO TBEC: 81.0000 mg | DELAYED_RELEASE_TABLET | Freq: Every day | ORAL | 12 refills | Status: DC

## 2022-05-05 NOTE — Progress Notes (Signed)
New OB labs drawn today. Vaginal self swab completed to rule out any infection as cause for lower abdominal pain. Reviewed possibility of round ligament pain with patient. Will return for new OB visit on 05/13/22.

## 2022-05-05 NOTE — Patient Instructions (Signed)
Call ultrasound for cancellation at [phone]. This is the Maternal Fetal Medicine department.   Safe Medications in Pregnancy   Acne:  Benzoyl Peroxide  Salicylic Acid   Backache/Headache:  Tylenol: 2 regular strength every 4 hours OR               2 Extra strength every 6 hours   Colds/Coughs/Allergies:  Benadryl (alcohol free) 25 mg every 6 hours as needed  Breath right strips  Claritin  Cepacol throat lozenges  Chloraseptic throat spray  Cold-Eeze- up to three times per day  Cough drops, alcohol free  Flonase (by prescription only)  Guaifenesin  Mucinex  Robitussin DM (plain only, alcohol free)  Saline nasal spray/drops  Sudafed (pseudoephedrine) & Actifed * use only after [redacted] weeks gestation and if you do not have high blood pressure  Tylenol  Vicks Vaporub  Zinc lozenges  Zyrtec   Constipation:  Colace  Ducolax suppositories  Fleet enema  Glycerin suppositories  Metamucil  Milk of magnesia  Miralax  Senokot  Smooth move tea   Diarrhea:  Kaopectate  Imodium A-D   *NO pepto Bismol   Hemorrhoids:  Anusol  Anusol HC  Preparation H  Tucks   Indigestion:  Tums  Maalox  Mylanta  Zantac  Pepcid   Insomnia:  Benadryl (alcohol free)  every 6 hours as needed  Tylenol PM  Unisom, no Gelcaps   Leg Cramps:  Tums  MagGel   Nausea/Vomiting:  Bonine  Dramamine  Emetrol  Ginger extract  Sea bands  Meclizine  Nausea medication to take during pregnancy:  Unisom (doxylamine succinate 25 mg tablets) Take one tablet daily at bedtime. If symptoms are not adequately controlled, the dose can be increased to a maximum recommended dose of two tablets daily (1/2 tablet in the morning, 1/2 tablet mid-afternoon and one at bedtime).  Vitamin B6  tablets. Take one tablet twice a day (up to 200 mg per day).   Skin Rashes:  Aveeno products  Benadryl cream or  every 6 hours as needed  Calamine Lotion  1% cortisone cream   Yeast infection:   Gyne-lotrimin 7  Monistat 7    **If taking multiple medications, please check labels to avoid duplicating the same active ingredients  **take medication as directed on the label  ** Do not exceed 4000 mg of tylenol in 24 hours  **Do not take medications that contain aspirin or ibuprofen

## 2022-05-05 NOTE — Progress Notes (Signed)
Possible Pregnancy  Here today for pregnancy confirmation. UPT in office today is positive. Describes regular menstrual period at the beginning of each month. Last period was in December. Pt reports first positive home UPT January 2024. Reviewed approximate dating with patient:   LMP: 12/19/21 approx EDD: 09/25/22 19w 4d today  FHR 156 today. OB history reviewed; single full-term, vaginal birth. History of pre-eclampsia. Reviewed medications and allergies with patient; list of medications safe to take during pregnancy given. Recommended pt begin prenatal vitamin and schedule prenatal care. Cresenzo, MD to bedside for brief visit.  Marjo Bicker, RN 05/05/2022  1:54 PM
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
## 2022-05-06 LAB — CBC/D/PLT+RPR+RH+ABO+RUBIGG...
Antibody Screen: NEGATIVE
Basophils Absolute: 0 10*3/uL (ref 0.0–0.2)
Basos: 1 %
EOS (ABSOLUTE): 0.1 10*3/uL (ref 0.0–0.4)
Eos: 2 %
HCV Ab: NONREACTIVE
HIV Screen 4th Generation wRfx: NONREACTIVE
Hematocrit: 37.1 % (ref 34.0–46.6)
Hemoglobin: 12.1 g/dL (ref 11.1–15.9)
Hepatitis B Surface Ag: NEGATIVE
Immature Grans (Abs): 0 10*3/uL (ref 0.0–0.1)
Immature Granulocytes: 0 %
Lymphocytes Absolute: 1 10*3/uL (ref 0.7–3.1)
Lymphs: 17 %
MCH: 27.5 pg (ref 26.6–33.0)
MCHC: 32.6 g/dL (ref 31.5–35.7)
MCV: 84 fL (ref 79–97)
Monocytes Absolute: 0.7 10*3/uL (ref 0.1–0.9)
Monocytes: 11 %
Neutrophils Absolute: 4.1 10*3/uL (ref 1.4–7.0)
Neutrophils: 69 %
Platelets: 266 10*3/uL (ref 150–450)
RBC: 4.4 x10E6/uL (ref 3.77–5.28)
RDW: 14.8 % (ref 11.7–15.4)
RPR Ser Ql: NONREACTIVE
Rh Factor: POSITIVE
Rubella Antibodies, IGG: 1.2 index (ref >0.99)
WBC: 6 10*3/uL (ref 3.4–10.8)

## 2022-05-06 LAB — CERVICOVAGINAL ANCILLARY ONLY
Bacterial Vaginitis (gardnerella): POSITIVE — AB
Candida Glabrata: NEGATIVE
Candida Vaginitis: POSITIVE — AB
Chlamydia: NEGATIVE
Comment: NEGATIVE
Comment: NEGATIVE
Comment: NEGATIVE
Comment: NEGATIVE
Comment: NEGATIVE
Comment: NORMAL
Neisseria Gonorrhea: NEGATIVE
Trichomonas: NEGATIVE

## 2022-05-06 LAB — COMPREHENSIVE METABOLIC PANEL
ALT: 10 IU/L (ref 0–32)
AST: 13 IU/L (ref 0–40)
Albumin/Globulin Ratio: 1.6 (ref 1.2–2.2)
Albumin: 4.1 g/dL (ref 4.0–5.0)
Alkaline Phosphatase: 75 IU/L (ref 44–121)
BUN/Creatinine Ratio: 11 (ref 9–23)
BUN: 7 mg/dL (ref 6–20)
Bilirubin Total: 0.2 mg/dL (ref 0.0–1.2)
CO2: 18 mmol/L — ABNORMAL LOW (ref 20–29)
Calcium: 9.6 mg/dL (ref 8.7–10.2)
Chloride: 102 mmol/L (ref 96–106)
Creatinine, Ser: 0.62 mg/dL (ref 0.57–1.00)
Globulin, Total: 2.5 g/dL (ref 1.5–4.5)
Glucose: 83 mg/dL (ref 70–99)
Potassium: 4.1 mmol/L (ref 3.5–5.2)
Sodium: 136 mmol/L (ref 134–144)
Total Protein: 6.6 g/dL (ref 6.0–8.5)
eGFR: 127 mL/min/{1.73_m2} (ref >59)

## 2022-05-06 LAB — HEMOGLOBIN A1C
Est. average glucose Bld gHb Est-mCnc: 105 mg/dL
Hgb A1c MFr Bld: 5.3 % (ref 4.8–5.6)

## 2022-05-06 LAB — HCV INTERPRETATION

## 2022-05-07 LAB — URINE CULTURE, OB REFLEX

## 2022-05-07 LAB — PROTEIN / CREATININE RATIO, URINE
Creatinine, Urine: 329.4 mg/dL
Protein, Ur: 19.3 mg/dL
Protein/Creat Ratio: 59 mg/g creat (ref 0–200)

## 2022-05-07 LAB — CULTURE, OB URINE

## 2022-05-13 ENCOUNTER — Ambulatory Visit (INDEPENDENT_AMBULATORY_CARE_PROVIDER_SITE_OTHER): Payer: 59 | Admitting: Certified Nurse Midwife

## 2022-05-13 ENCOUNTER — Other Ambulatory Visit: Payer: Self-pay

## 2022-05-13 ENCOUNTER — Encounter: Payer: Self-pay | Admitting: Certified Nurse Midwife

## 2022-05-13 VITALS — BP 126/72 | HR 89 | Wt 238.5 lb

## 2022-05-13 DIAGNOSIS — Z3492 Encounter for supervision of normal pregnancy, unspecified, second trimester: Secondary | ICD-10-CM

## 2022-05-13 DIAGNOSIS — Z8759 Personal history of other complications of pregnancy, childbirth and the puerperium: Secondary | ICD-10-CM

## 2022-05-13 DIAGNOSIS — N760 Acute vaginitis: Secondary | ICD-10-CM

## 2022-05-13 DIAGNOSIS — Z3A20 20 weeks gestation of pregnancy: Secondary | ICD-10-CM | POA: Diagnosis not present

## 2022-05-13 DIAGNOSIS — B9689 Other specified bacterial agents as the cause of diseases classified elsewhere: Secondary | ICD-10-CM

## 2022-05-13 DIAGNOSIS — O0932 Supervision of pregnancy with insufficient antenatal care, second trimester: Secondary | ICD-10-CM | POA: Diagnosis not present

## 2022-05-13 DIAGNOSIS — Z87898 Personal history of other specified conditions: Secondary | ICD-10-CM | POA: Diagnosis not present

## 2022-05-13 DIAGNOSIS — B3731 Acute candidiasis of vulva and vagina: Secondary | ICD-10-CM

## 2022-05-13 LAB — PANORAMA PRENATAL TEST FULL PANEL:PANORAMA TEST PLUS 5 ADDITIONAL MICRODELETIONS: FETAL FRACTION: 6.4

## 2022-05-13 LAB — HORIZON CUSTOM: REPORT SUMMARY: NEGATIVE

## 2022-05-13 MED ORDER — FLUCONAZOLE 150 MG PO TABS
150.0000 mg | ORAL_TABLET | Freq: Every day | ORAL | 1 refills | Status: DC
Start: 2022-05-13 — End: 2022-06-24

## 2022-05-13 MED ORDER — METRONIDAZOLE 0.75 % VA GEL
1.0000 | Freq: Every day | VAGINAL | 0 refills | Status: AC
Start: 2022-05-13 — End: 2022-05-20
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
## 2022-05-13 NOTE — Patient Instructions (Signed)
[place] Pediatric Providers  [place] ([address]) [facility] Brown, MD; Chambliss, MD; Eniola, MD; Hensel, MD; McDiarmid, MD; McIntyer, MD [address] [place], [place] [address] [phone] Mon-Fri 8:30-12:30, 1:30-5:00  Providers come to see babies during newborn hospitalization Only accepting infants of Mother's who are seen at Family Medicine Center or have siblings seen at   Family [facility] Center Medicaid - Yes; Tricare - Yes   [facility] Mulberry, MD [address] [place], [place] [address] [phone] Mon, Tue, Thur, Fri 8:30-5:00, Wed 10:00-7:00 (closed 1-2pm daily for lunch) Takes [place] residents with no insurance.  [place] [facility] only with Medicaid/insurance; Tricare - no  [facility] Ben-Davies, MD; Brown, MD; Chandler, MD; Ettefagh, MD; [place], MD; Hanvey, MD; Herrin, MD; Jones,  MD; Lester, MD; McCormick, MD; McQueen, MD; Simha, MD; Stanley, MD; Stryffeler, NP [address], [place], [place] [address] [phone] Mon, Tue, Thur, Fri 8:30-5:30, Wed 9:30-5:30, Sat 8:30-12:30 Only accepting infants of first-time parents or siblings of current patients Hospital discharge coordinator will make follow-up appointment Medicaid - yes; Tricare - yes  [place] ([address]) [facility] Cox, MD; Davis, MD; Dovico, MD; Ettefaugh, MD; Lowe, MD; Nation, MD; Slimp, MD; Sumner, MD; Williams, MD [address], [place], [place] [address] [phone] Mon-Fri 8:30-5:00, closed for lunch 12:30-1:30; Sat-Sun 10:00-1:00 Accepting Newborns with commercial insurance only, must call prior to delivery to be accepted into  practice.  Medicaid - no, Tricare - yes   [facility] [address] [place], [place] [address] [phone] or [phone] Mon to Fri 8am to 10pm, Sat 8am to 1pm  (virtual only on weekends) Only accepts Medicaid Healthy Blue pts  [facility] [place]  Artis, MD; Coccaro, MD; Lockett Gardner, MD; Netherton, NP; Roper, MD; Wilmot, PA-C; Skinner, MD [address] [place], [place] [address] [phone] Mon-Fri 8:30-5:30 Medicaid - yes, Tricare - yes  [place] ([address]) [facility] Warner, MD [address], [place], [place] [address] [phone] Mon, Tues, Wed Fri 8:30-5:00, Sat 8:30-12:00, Closed Thursdays Accepting siblings of established patients and first time mom's if you call prenatally Medicaid- yes; Tricare - yes  [facility] Becker, PA; Hagler, MD; Quinn, PA-C; Scifres, PA; Sun, MD; Swayne, MD;  [address], [place], [place] [address] [phone] Mon-Fri 8:30-5:00, closed for lunch 1-2 Only accepting newborns of established patients Medicaid- no; Tricare - yes  [place] ([address]) [facility] Timberlake, MD; [address], [place], [place] [address] [phone] Mon-Fri 8:00-5:00 Medicaid - No; Tricare - Yes  [facility] [employer]  Brake, NP; Wharton, PA [address], [place], [place] [address] [phone] Mon-Fri 8:00-5:00 Medicaid - No, Tricare - Yes  [facility] Gay, MD; Quinlan, MD; Blatt, DNP [address] [place], [place] [address] [phone]  Mon-Fri 8:00-5:00 Medicaid - No; Tricare - Yes  [facility] [address] [place], [place] [address] [phone] Mon-Fri 8:30-5:00 (lunch 12:00-1:00) Medicaid -Yes; Tricare - Yes  [facility] Jordan, MD [address], [place], [place] [address] [phone] Mon-Fri 8:00-5:00 Seeing newborns of current patients only. No new patients Medicaid - No, Tricare - yes  [facility] Parker, MD [address] [place], [place] [address] [phone] Mon-Fri 8:00-5:00 Medicaid -yes as secondary coverage only;   Tricare - yes  [facility] [place], [place]; [place], [place]; Avis Epley, MD; Vonna Kotyk, MD; Clance Boll, MD; [place], [place]; Smoot, NP; Vaughan Basta, MD; [place], MD [address] [place], [place] [address] [phone] Mon-Fri 8:30-5:00, Sat 9:00-11:00 Accepts commercial insurance ONLY. Offers free prenatal information sessions for families. Medicaid - No, Tricare - Call first  [facility] Racine, MD; [place], [place]; [place], [place]; [place], [place] [address] [place] [address] [phone] Mon-Fri 7:30-5:30 Medicaid - Yes; Ailene Rud yes  [place] [phone] & [phone])  [facility], MD [address], [place], [place] [address] [phone] Mon-Thur 8:00-6:00, closed for lunch 12-2, closed Fridays Medicaid - yes; Tricare - no  [facility] Dareen Piano, NP; Cyndia Bent, MD; [place], [place]; [place], [place] [address], [place], [place] [address] [phone] Mon-Fri 7:30-4:30 Medicaid - yes, Tricare - yes  [facility]  Juanito Doom, MD; Janene Harvey, NP; Vonita Moss, MD; Donn Pierini, NP [address], [place], [place] [address] [phone] Mon-Fri 8:30-5:00, closed for lunch 1-2, Sat 8:30-12:00 - sick visits only Providers come to see babies at [facility] Only accepting newborns of siblings and first time parents ONLY if who have met with office prior to delivery Medicaid -Yes; Tricare - yes  [facility] [place], [place]; Spero Geralds, NP; Earlene Plater, MD; Lucretia Roers, MD:  [address], [place], [place] [address] [phone] Mon- Fri 8:00-5:00, Sat 9:00-12:00 - sick visits only Accepting siblings of established patients and first time mom/baby Medicaid - Yes; Tricare - yes Patients must have vaccinations (baby vaccines)  [place] [phone] &  [phone])  Adult nurse HealthCare at [facility] [address] [place], [place] [address] [phone] Mon-Fri 8:00-5:00 Medicaid - no; Tricare - yes  [facility] [place], MD; [place], [place]; [place], [place] [address], [place], [place] [address] [phone] Mon-Fri 8:00-5:00 Medicaid- yes; Tricare - yes  [facility] - [place] Jourdain, MD; Yetta Barre, NP; [place], [place] [address] [place], [place], [place] [address] [phone] Mon-Fri 8:00-5:00 Medicaid - Yes; Tricare - yes

## 2022-05-15 LAB — AFP, SERUM, OPEN SPINA BIFIDA
AFP MoM: 1.34
AFP Value: 55.8 ng/mL
Gest. Age on Collection Date: 20.5 weeks
Maternal Age At EDD: 25.9 yr
OSBR Risk 1 IN: 2551
Test Results:: NEGATIVE
Weight: 238 [lb_av]

## 2022-06-02 DIAGNOSIS — O99210 Obesity complicating pregnancy, unspecified trimester: Secondary | ICD-10-CM | POA: Insufficient documentation

## 2022-06-04 ENCOUNTER — Ambulatory Visit: Payer: 59

## 2022-06-04 ENCOUNTER — Ambulatory Visit: Payer: 59 | Attending: Family Medicine

## 2022-06-04 DIAGNOSIS — O99210 Obesity complicating pregnancy, unspecified trimester: Secondary | ICD-10-CM
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
## 2022-06-10 ENCOUNTER — Encounter: Payer: 59 | Admitting: Obstetrics and Gynecology

## 2022-06-11 ENCOUNTER — Encounter: Payer: 59 | Admitting: Family Medicine

## 2022-06-11 DIAGNOSIS — O99210 Obesity complicating pregnancy, unspecified trimester: Secondary | ICD-10-CM

## 2022-06-11 DIAGNOSIS — Z3A24 24 weeks gestation of pregnancy: Secondary | ICD-10-CM

## 2022-06-11 DIAGNOSIS — Z3492 Encounter for supervision of normal pregnancy, unspecified, second trimester: Secondary | ICD-10-CM

## 2022-06-11 DIAGNOSIS — Z8759 Personal history of other complications of pregnancy, childbirth and the puerperium: Secondary | ICD-10-CM

## 2022-06-24 ENCOUNTER — Other Ambulatory Visit: Payer: Self-pay

## 2022-06-24 ENCOUNTER — Ambulatory Visit (INDEPENDENT_AMBULATORY_CARE_PROVIDER_SITE_OTHER): Payer: 59 | Admitting: Obstetrics & Gynecology

## 2022-06-24 VITALS — BP 118/73 | HR 99 | Wt 236.3 lb

## 2022-06-24 DIAGNOSIS — O99210 Obesity complicating pregnancy, unspecified trimester: Secondary | ICD-10-CM

## 2022-06-24 DIAGNOSIS — Z8759 Personal history of other complications of pregnancy, childbirth and the puerperium: Secondary | ICD-10-CM

## 2022-06-24 DIAGNOSIS — Z3A26 26 weeks gestation of pregnancy: Secondary | ICD-10-CM

## 2022-06-24 DIAGNOSIS — Z3492 Encounter for supervision of normal pregnancy, unspecified, second trimester: Secondary | ICD-10-CM

## 2022-06-24 DIAGNOSIS — O99212 Obesity complicating pregnancy, second trimester: Secondary | ICD-10-CM
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
## 2022-07-06 ENCOUNTER — Other Ambulatory Visit: Payer: Self-pay

## 2022-07-06 DIAGNOSIS — Z3490 Encounter for supervision of normal pregnancy, unspecified, unspecified trimester: Secondary | ICD-10-CM

## 2022-07-07 ENCOUNTER — Other Ambulatory Visit: Payer: Self-pay

## 2022-07-07 ENCOUNTER — Other Ambulatory Visit: Payer: 59

## 2022-07-07 ENCOUNTER — Ambulatory Visit (INDEPENDENT_AMBULATORY_CARE_PROVIDER_SITE_OTHER): Payer: 59 | Admitting: Obstetrics & Gynecology

## 2022-07-07 VITALS — BP 112/64 | HR 90 | Wt 236.0 lb

## 2022-07-07 DIAGNOSIS — Z3490 Encounter for supervision of normal pregnancy, unspecified, unspecified trimester: Secondary | ICD-10-CM

## 2022-07-07 DIAGNOSIS — Z3493 Encounter for supervision of normal pregnancy, unspecified, third trimester: Secondary | ICD-10-CM

## 2022-07-07 DIAGNOSIS — Z8759 Personal history of other complications of pregnancy, childbirth and the puerperium: Secondary | ICD-10-CM

## 2022-07-07 DIAGNOSIS — Z3A28 28 weeks gestation of pregnancy: Secondary | ICD-10-CM

## 2022-07-07 DIAGNOSIS — Z23 Encounter for immunization: Secondary | ICD-10-CM | POA: Diagnosis not present

## 2022-07-07 NOTE — Progress Notes (Signed)
263

## 2022-07-07 NOTE — Progress Notes (Signed)
Patient here for routine prenatal visit.  I explained that she is due for Tdap vaccine and she accepted.
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
## 2022-07-08 LAB — GLUCOSE TOLERANCE, 2 HOURS W/ 1HR
Glucose, 1 hour: 193 mg/dL — ABNORMAL HIGH (ref 70–179)
Glucose, 2 hour: 89 mg/dL (ref 70–152)
Glucose, Fasting: 77 mg/dL (ref 70–91)

## 2022-07-08 LAB — CBC
Hematocrit: 33 % — ABNORMAL LOW (ref 34.0–46.6)
Hemoglobin: 10.8 g/dL — ABNORMAL LOW (ref 11.1–15.9)
MCH: 28.3 pg (ref 26.6–33.0)
MCHC: 32.7 g/dL (ref 31.5–35.7)
MCV: 86 fL (ref 79–97)
Platelets: 257 10*3/uL (ref 150–450)
RBC: 3.82 x10E6/uL (ref 3.77–5.28)
RDW: 15 % (ref 11.7–15.4)
WBC: 7 10*3/uL (ref 3.4–10.8)

## 2022-07-08 LAB — HIV ANTIBODY (ROUTINE TESTING W REFLEX): HIV Screen 4th Generation wRfx: NONREACTIVE

## 2022-07-08 LAB — RPR: RPR Ser Ql: NONREACTIVE

## 2022-07-09 ENCOUNTER — Encounter: Payer: Self-pay | Admitting: General Practice

## 2022-07-26 ENCOUNTER — Encounter: Payer: Self-pay | Admitting: Obstetrics & Gynecology

## 2022-07-26 DIAGNOSIS — O24419 Gestational diabetes mellitus in pregnancy, unspecified control: Secondary | ICD-10-CM | POA: Insufficient documentation

## 2022-07-26 DIAGNOSIS — Z8632 Personal history of gestational diabetes: Secondary | ICD-10-CM | POA: Insufficient documentation

## 2022-07-26 NOTE — Progress Notes (Signed)
Needs to see diabetes educator for failed GTT

## 2022-07-27 ENCOUNTER — Telehealth: Payer: Self-pay | Admitting: *Deleted

## 2022-07-27 ENCOUNTER — Other Ambulatory Visit: Payer: Self-pay | Admitting: Family Medicine

## 2022-07-27 ENCOUNTER — Ambulatory Visit: Payer: 59 | Attending: Family Medicine

## 2022-07-27 ENCOUNTER — Ambulatory Visit: Payer: 59 | Admitting: *Deleted

## 2022-07-27 VITALS — BP 126/63 | HR 79

## 2022-07-27 DIAGNOSIS — Z3492 Encounter for supervision of normal pregnancy, unspecified, second trimester: Secondary | ICD-10-CM

## 2022-07-27 DIAGNOSIS — Z3A30 30 weeks gestation of pregnancy: Secondary | ICD-10-CM | POA: Insufficient documentation

## 2022-07-27 DIAGNOSIS — O99213 Obesity complicating pregnancy, third trimester: Secondary | ICD-10-CM | POA: Diagnosis not present

## 2022-07-27 DIAGNOSIS — O24410 Gestational diabetes mellitus in pregnancy, diet controlled: Secondary | ICD-10-CM | POA: Insufficient documentation

## 2022-07-27 DIAGNOSIS — O09293 Supervision of pregnancy with other poor reproductive or obstetric history, third trimester: Secondary | ICD-10-CM | POA: Diagnosis not present

## 2022-07-27 DIAGNOSIS — O0933 Supervision of pregnancy with insufficient antenatal care, third trimester: Secondary | ICD-10-CM | POA: Insufficient documentation

## 2022-07-27 DIAGNOSIS — Z8759 Personal history of other complications of pregnancy, childbirth and the puerperium: Secondary | ICD-10-CM | POA: Insufficient documentation

## 2022-07-27 NOTE — Telephone Encounter (Signed)
I called patient and informed her of failed GTT and new DX GDM and gave her first available appointment for Diabetes educator for 08/06/22. She voices understanding.  Nancy Fetter

## 2022-07-27 NOTE — Telephone Encounter (Signed)
-----   Message from Adam Phenix, MD sent at 07/26/2022 10:03 AM EDT ----- Needs to see diabetes educator for failed GTT

## 2022-07-28 ENCOUNTER — Other Ambulatory Visit (INDEPENDENT_AMBULATORY_CARE_PROVIDER_SITE_OTHER): Payer: Self-pay | Admitting: Primary Care

## 2022-07-28 ENCOUNTER — Other Ambulatory Visit: Payer: Self-pay

## 2022-07-28 ENCOUNTER — Ambulatory Visit (INDEPENDENT_AMBULATORY_CARE_PROVIDER_SITE_OTHER): Payer: 59 | Admitting: Family Medicine

## 2022-07-28 VITALS — BP 114/73 | HR 86 | Wt 236.9 lb

## 2022-07-28 DIAGNOSIS — Z3493 Encounter for supervision of normal pregnancy, unspecified, third trimester: Secondary | ICD-10-CM

## 2022-07-28 DIAGNOSIS — Z3A30 30 weeks gestation of pregnancy: Secondary | ICD-10-CM | POA: Diagnosis not present

## 2022-07-28 DIAGNOSIS — O24410 Gestational diabetes mellitus in pregnancy, diet controlled: Secondary | ICD-10-CM

## 2022-07-28 MED ORDER — ACCU-CHEK SOFTCLIX LANCETS MISC
11 refills | Status: DC
Start: 2022-07-28 — End: 2022-09-17

## 2022-07-28 MED ORDER — ACCU-CHEK GUIDE VI STRP
ORAL_STRIP | 11 refills | Status: DC
Start: 2022-07-28 — End: 2022-09-17

## 2022-07-28 MED ORDER — ACCU-CHEK GUIDE W/DEVICE KIT
1.00 | PACK | Freq: Once | 0 refills | Status: AC
Start: 2022-07-28 — End: 2022-07-28
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
## 2022-07-28 NOTE — Telephone Encounter (Signed)
05/13/2022 by Marjo Bicker, RN for the following reason: Patient Preference  Requested Prescriptions  Refused Prescriptions Disp Refills   senna (SENOKOT) 8.6 MG TABS tablet [Pharmacy Med Name: SENNA 8.6MG  TABLETS] 90 tablet 1    Sig: TAKE 1 TABLET(8.6 MG) BY MOUTH DAILY     Over the Counter:  OTC Passed - 07/28/2022 10:32 AM      Passed - Valid encounter within last 12 months    Recent Outpatient Visits           5 months ago Syncope and collapse   [place] [facility] Grayce Sessions, NP   6 months ago BV (bacterial vaginosis)   [place] [facility] Grayce Sessions, NP   7 months ago Cervical cancer screening   [place] [facility] Grayce Sessions, NP   11 months ago Constipation, unspecified constipation type   [place] [facility] Grayce Sessions, NP   3 years ago Dysuria   [place] [facility] Grayce Sessions, NP

## 2022-07-28 NOTE — Patient Instructions (Signed)
Following an appropriate diet and keeping your blood sugar under control is the most important thing to do for your health and that of your unborn baby.  Please check your blood sugar 4 times daily.  Please keep accurate BS logs and bring them with you to every visit.  Please bring your meter also.  Goals for Blood sugar should be: 1. Fasting (first thing in the morning before eating) should be less than 90.   2.  2 hours after meals should be less than 120.  Please eat 3 meals and 3 snacks.  Include protein (meat, dairy-cheese, eggs, nuts) with all meals.  Be mindful that carbohydrates increase your blood sugar.  Not just sweet food (cookies, cake, donuts, fruit, juice, soda) but also bread, pasta, rice, and potatoes.  You have to limit how many carbs you are eating.  Adding exercise, as little as 30 minutes a day can decrease your blood sugar.  

## 2022-08-06 ENCOUNTER — Ambulatory Visit (INDEPENDENT_AMBULATORY_CARE_PROVIDER_SITE_OTHER): Payer: 59 | Admitting: Skilled Nursing Facility1

## 2022-08-06 ENCOUNTER — Encounter: Payer: 59 | Attending: Primary Care | Admitting: Skilled Nursing Facility1

## 2022-08-06 ENCOUNTER — Other Ambulatory Visit: Payer: Self-pay

## 2022-08-06 VITALS — Ht 71.0 in | Wt 237.8 lb

## 2022-08-06 DIAGNOSIS — Z3A31 31 weeks gestation of pregnancy: Secondary | ICD-10-CM

## 2022-08-06 DIAGNOSIS — Z713 Dietary counseling and surveillance: Secondary | ICD-10-CM | POA: Diagnosis not present

## 2022-08-06 DIAGNOSIS — O24410 Gestational diabetes mellitus in pregnancy, diet controlled: Secondary | ICD-10-CM | POA: Diagnosis not present

## 2022-08-06 DIAGNOSIS — Z3A33 33 weeks gestation of pregnancy: Secondary | ICD-10-CM | POA: Diagnosis not present

## 2022-08-06 DIAGNOSIS — O0993 Supervision of high risk pregnancy, unspecified, third trimester: Secondary | ICD-10-CM | POA: Diagnosis not present

## 2022-08-06 NOTE — Progress Notes (Signed)
Patient was seen for Gestational Diabetes self-management on 08/06/2022  Start time 1:22 and End time 2:15   Estimated due date: [redacted]w[redacted]d  Clinical: Medications: see list Medical History: syncope Labs: OGTT 77, A1c 5.3%   Lab Results  Component Value Date   HGBA1C 5.3 05/05/2022   Wt Readings from Last 3 Encounters:  08/06/22 237 lb 12.8 oz (107.9 kg)  07/28/22 236 lb 14.4 oz (107.5 kg)  07/07/22 236 lb (107 kg)    Dietary and Lifestyle History:  Seems pts blood sugar typically falls on the lower end which may contribute to the Dizziness and feeling tired: Dietitian advised she keep her doctor abreast of these symptoms and ensure she tests 4 times a day to ensure she is not having hypoglycemia.   Pt states she has been the same weight throughout her pregnancy.  Pt states she has been checking her blood sugar but does not know how to use it.  Pt states she feels dizzy for the last few years.  Pt states she feels bad if she eats after 8pm stating she does not want to feel greedy. Pt states she lives with her mom stating she cooks or her mom cooks.  Pt states she does not do much meat right now.  Pt states she has passed out before stating sometimes just standing there sometimes upon standing.   Pt states she is having issues with puss and crust in her eyes after swimming.   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  Stress: fairly low Sleep: no concerns   24 hr Recall:  First Meal: bacon egg and cheese sandwich (made from home) or fast food Snack:  Second meal 1-2: ham sandwich or spagetti or fast food Snack: smoothie: honey, pineapple, banana, strawberry, yogurt Third meal: fish + salad Snack: Beverages: water, kool aid, juice  NUTRITION INTERVENTION  Nutrition education (E-1) on the following topics:   Initial Follow-up  []  []  Definition of Gestational Diabetes [x]  []  Why dietary management is important in controlling blood glucose [x]  []  Effects each  nutrient has on blood glucose levels [x]  []  Simple carbohydrates vs complex carbohydrates [x]  []  Fluid intake [x]  []  Creating a balanced meal plan [x]  []  Carbohydrate counting  [x]  []  When to check blood glucose levels [x]  []  Proper blood glucose monitoring techniques [x]  []  Effect of stress and stress reduction techniques  [x]  []  Exercise effect on blood glucose levels, appropriate exercise during pregnancy [x]  []  Importance of limiting caffeine and abstaining from alcohol and smoking [x]  []  Medications used for blood sugar control during pregnancy [x]  []  Hypoglycemia and rule of 15 [x]  []  Postpartum self care  -Reviewed plant based protein sources   Patient has a meter prior to visit. Patient is  testing pre breakfast and 2 hours after each meal. FBS:   Postprandial: 70: 3.5 hours after cereal   Patient instructed to monitor glucose levels: FBS: 60 - ? 95 mg/dL; 2 hour: ? 308 mg/dL  Patient received handouts: Nutrition Diabetes and Pregnancy Carbohydrate Counting List  Patient will be seen for follow-up as needed.

## 2022-08-13 NOTE — Addendum Note (Signed)
Addended by: Serena Colonel B on: 08/13/2022 04:39 PM   Modules accepted: Level of Service

## 2022-08-17 ENCOUNTER — Other Ambulatory Visit: Payer: Self-pay

## 2022-08-17 ENCOUNTER — Encounter: Payer: Self-pay | Admitting: Obstetrics & Gynecology

## 2022-08-17 ENCOUNTER — Ambulatory Visit (INDEPENDENT_AMBULATORY_CARE_PROVIDER_SITE_OTHER): Payer: 59 | Admitting: Obstetrics & Gynecology

## 2022-08-17 VITALS — BP 130/75 | HR 94 | Wt 234.4 lb

## 2022-08-17 DIAGNOSIS — Z8759 Personal history of other complications of pregnancy, childbirth and the puerperium: Secondary | ICD-10-CM

## 2022-08-17 DIAGNOSIS — O24410 Gestational diabetes mellitus in pregnancy, diet controlled: Secondary | ICD-10-CM

## 2022-08-17 DIAGNOSIS — O0993 Supervision of high risk pregnancy, unspecified, third trimester: Secondary | ICD-10-CM

## 2022-08-17 DIAGNOSIS — Z3A33 33 weeks gestation of pregnancy: Secondary | ICD-10-CM
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
## 2022-08-17 NOTE — Patient Instructions (Signed)

## 2022-08-19 ENCOUNTER — Ambulatory Visit: Payer: 59

## 2022-08-19 ENCOUNTER — Ambulatory Visit: Payer: 59 | Attending: Primary Care

## 2022-08-24 ENCOUNTER — Other Ambulatory Visit: Payer: Self-pay

## 2022-08-24 ENCOUNTER — Ambulatory Visit (INDEPENDENT_AMBULATORY_CARE_PROVIDER_SITE_OTHER): Payer: 59 | Admitting: Obstetrics & Gynecology

## 2022-08-24 VITALS — BP 108/71 | HR 86 | Wt 234.0 lb

## 2022-08-24 DIAGNOSIS — O0993 Supervision of high risk pregnancy, unspecified, third trimester: Secondary | ICD-10-CM

## 2022-08-24 DIAGNOSIS — O24419 Gestational diabetes mellitus in pregnancy, unspecified control: Secondary | ICD-10-CM

## 2022-08-24 DIAGNOSIS — Z3A34 34 weeks gestation of pregnancy: Secondary | ICD-10-CM

## 2022-08-24 DIAGNOSIS — Z8759 Personal history of other complications of pregnancy, childbirth and the puerperium: Secondary | ICD-10-CM

## 2022-08-24 DIAGNOSIS — R7309 Other abnormal glucose: Secondary | ICD-10-CM | POA: Insufficient documentation
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
## 2022-08-27 ENCOUNTER — Ambulatory Visit: Payer: 59 | Admitting: *Deleted

## 2022-08-27 ENCOUNTER — Other Ambulatory Visit: Payer: Self-pay | Admitting: Obstetrics & Gynecology

## 2022-08-27 ENCOUNTER — Encounter: Payer: Self-pay | Admitting: Obstetrics & Gynecology

## 2022-08-27 ENCOUNTER — Ambulatory Visit: Payer: 59 | Attending: Obstetrics & Gynecology

## 2022-08-27 ENCOUNTER — Other Ambulatory Visit: Payer: Self-pay | Admitting: *Deleted

## 2022-08-27 VITALS — BP 126/66 | HR 78

## 2022-08-27 DIAGNOSIS — O365930 Maternal care for other known or suspected poor fetal growth, third trimester, not applicable or unspecified: Secondary | ICD-10-CM | POA: Diagnosis not present

## 2022-08-27 DIAGNOSIS — O0993 Supervision of high risk pregnancy, unspecified, third trimester: Secondary | ICD-10-CM

## 2022-08-27 DIAGNOSIS — O09293 Supervision of pregnancy with other poor reproductive or obstetric history, third trimester: Secondary | ICD-10-CM | POA: Diagnosis not present

## 2022-08-27 DIAGNOSIS — O99213 Obesity complicating pregnancy, third trimester: Secondary | ICD-10-CM | POA: Diagnosis not present

## 2022-08-27 DIAGNOSIS — O24419 Gestational diabetes mellitus in pregnancy, unspecified control: Secondary | ICD-10-CM

## 2022-08-27 DIAGNOSIS — Z3492 Encounter for supervision of normal pregnancy, unspecified, second trimester: Secondary | ICD-10-CM | POA: Insufficient documentation

## 2022-08-27 DIAGNOSIS — E669 Obesity, unspecified: Secondary | ICD-10-CM | POA: Diagnosis not present

## 2022-08-27 DIAGNOSIS — Z3A34 34 weeks gestation of pregnancy: Secondary | ICD-10-CM | POA: Diagnosis not present

## 2022-08-27 DIAGNOSIS — O0933 Supervision of pregnancy with insufficient antenatal care, third trimester: Secondary | ICD-10-CM

## 2022-08-27 DIAGNOSIS — Z8759 Personal history of other complications of pregnancy, childbirth and the puerperium: Secondary | ICD-10-CM

## 2022-08-27 DIAGNOSIS — Z3A33 33 weeks gestation of pregnancy: Secondary | ICD-10-CM | POA: Diagnosis not present

## 2022-08-27 DIAGNOSIS — O24410 Gestational diabetes mellitus in pregnancy, diet controlled: Secondary | ICD-10-CM | POA: Diagnosis not present

## 2022-08-27 DIAGNOSIS — R7309 Other abnormal glucose: Secondary | ICD-10-CM

## 2022-09-03 ENCOUNTER — Ambulatory Visit: Payer: 59 | Attending: Obstetrics and Gynecology

## 2022-09-03 ENCOUNTER — Ambulatory Visit: Payer: 59

## 2022-09-07 ENCOUNTER — Other Ambulatory Visit: Payer: Self-pay | Admitting: *Deleted

## 2022-09-07 ENCOUNTER — Ambulatory Visit (INDEPENDENT_AMBULATORY_CARE_PROVIDER_SITE_OTHER): Payer: 59 | Admitting: Obstetrics and Gynecology

## 2022-09-07 ENCOUNTER — Other Ambulatory Visit: Payer: Self-pay

## 2022-09-07 ENCOUNTER — Other Ambulatory Visit (HOSPITAL_COMMUNITY)
Admission: RE | Admit: 2022-09-07 | Discharge: 2022-09-07 | Disposition: A | Discharge status: Home / Self Care | Payer: 59 | Source: Ambulatory Visit | Attending: Obstetrics and Gynecology | Admitting: Obstetrics and Gynecology

## 2022-09-07 ENCOUNTER — Ambulatory Visit (INDEPENDENT_AMBULATORY_CARE_PROVIDER_SITE_OTHER): Payer: 59

## 2022-09-07 ENCOUNTER — Other Ambulatory Visit (HOSPITAL_COMMUNITY): Payer: Self-pay | Admitting: Advanced Practice Midwife

## 2022-09-07 VITALS — BP 128/79 | HR 90 | Wt 233.8 lb

## 2022-09-07 DIAGNOSIS — O24419 Gestational diabetes mellitus in pregnancy, unspecified control: Secondary | ICD-10-CM

## 2022-09-07 DIAGNOSIS — Z8759 Personal history of other complications of pregnancy, childbirth and the puerperium: Secondary | ICD-10-CM | POA: Diagnosis not present

## 2022-09-07 DIAGNOSIS — Z3A36 36 weeks gestation of pregnancy: Secondary | ICD-10-CM

## 2022-09-07 DIAGNOSIS — R7309 Other abnormal glucose: Secondary | ICD-10-CM

## 2022-09-07 DIAGNOSIS — O365930 Maternal care for other known or suspected poor fetal growth, third trimester, not applicable or unspecified: Secondary | ICD-10-CM

## 2022-09-07 DIAGNOSIS — O0993 Supervision of high risk pregnancy, unspecified, third trimester: Secondary | ICD-10-CM | POA: Insufficient documentation

## 2022-09-07 DIAGNOSIS — O365990 Maternal care for other known or suspected poor fetal growth, unspecified trimester, not applicable or unspecified: Secondary | ICD-10-CM
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
## 2022-09-08 ENCOUNTER — Encounter (HOSPITAL_COMMUNITY): Payer: Self-pay

## 2022-09-08 ENCOUNTER — Telehealth (HOSPITAL_COMMUNITY): Payer: Self-pay | Admitting: *Deleted

## 2022-09-08 NOTE — Telephone Encounter (Signed)
Preadmission screen  

## 2022-09-09 ENCOUNTER — Other Ambulatory Visit: Payer: Self-pay | Admitting: Advanced Practice Midwife

## 2022-09-09 LAB — GC/CHLAMYDIA PROBE AMP (~~LOC~~) NOT AT ARMC
Chlamydia: NEGATIVE
Comment: NEGATIVE
Comment: NORMAL
Neisseria Gonorrhea: NEGATIVE

## 2022-09-10 ENCOUNTER — Telehealth (HOSPITAL_COMMUNITY): Payer: Self-pay | Admitting: *Deleted

## 2022-09-10 ENCOUNTER — Ambulatory Visit: Payer: 59

## 2022-09-10 LAB — CULTURE, BETA STREP (GROUP B ONLY): Strep Gp B Culture: POSITIVE — AB

## 2022-09-10 NOTE — Telephone Encounter (Signed)
Preadmission screen  

## 2022-09-11 ENCOUNTER — Telehealth (HOSPITAL_COMMUNITY): Payer: Self-pay | Admitting: *Deleted

## 2022-09-11 ENCOUNTER — Encounter: Payer: Self-pay | Admitting: Obstetrics and Gynecology

## 2022-09-11 ENCOUNTER — Encounter (HOSPITAL_COMMUNITY): Payer: Self-pay | Admitting: *Deleted

## 2022-09-11 DIAGNOSIS — O99820 Streptococcus B carrier state complicating pregnancy: Secondary | ICD-10-CM | POA: Insufficient documentation

## 2022-09-11 NOTE — Telephone Encounter (Signed)
Preadmission screen  

## 2022-09-14 ENCOUNTER — Encounter: Payer: 59 | Admitting: Obstetrics and Gynecology

## 2022-09-14 ENCOUNTER — Inpatient Hospital Stay (HOSPITAL_COMMUNITY): Payer: 59

## 2022-09-14 ENCOUNTER — Encounter (HOSPITAL_COMMUNITY): Payer: Self-pay | Admitting: Obstetrics & Gynecology

## 2022-09-14 ENCOUNTER — Other Ambulatory Visit: Payer: Self-pay

## 2022-09-14 ENCOUNTER — Inpatient Hospital Stay (HOSPITAL_COMMUNITY)
Admission: RE | Admit: 2022-09-14 | Discharge: 2022-09-17 | DRG: 805 | Disposition: A | Discharge status: Home / Self Care | Payer: 59 | Attending: Family Medicine | Admitting: Family Medicine

## 2022-09-14 DIAGNOSIS — O9902 Anemia complicating childbirth: Secondary | ICD-10-CM | POA: Diagnosis not present

## 2022-09-14 DIAGNOSIS — D509 Iron deficiency anemia, unspecified: Secondary | ICD-10-CM | POA: Diagnosis not present

## 2022-09-14 DIAGNOSIS — O99820 Streptococcus B carrier state complicating pregnancy: Secondary | ICD-10-CM | POA: Diagnosis not present

## 2022-09-14 DIAGNOSIS — Z3A37 37 weeks gestation of pregnancy: Secondary | ICD-10-CM

## 2022-09-14 DIAGNOSIS — Z62810 Personal history of physical and sexual abuse in childhood: Secondary | ICD-10-CM

## 2022-09-14 DIAGNOSIS — Z8632 Personal history of gestational diabetes: Secondary | ICD-10-CM | POA: Diagnosis present

## 2022-09-14 DIAGNOSIS — O99210 Obesity complicating pregnancy, unspecified trimester: Secondary | ICD-10-CM | POA: Diagnosis present

## 2022-09-14 DIAGNOSIS — O99214 Obesity complicating childbirth: Secondary | ICD-10-CM | POA: Diagnosis present

## 2022-09-14 DIAGNOSIS — Z23 Encounter for immunization: Secondary | ICD-10-CM | POA: Diagnosis not present

## 2022-09-14 DIAGNOSIS — O4593 Premature separation of placenta, unspecified, third trimester: Secondary | ICD-10-CM | POA: Diagnosis not present

## 2022-09-14 DIAGNOSIS — O365930 Maternal care for other known or suspected poor fetal growth, third trimester, not applicable or unspecified: Secondary | ICD-10-CM | POA: Diagnosis not present

## 2022-09-14 DIAGNOSIS — O24420 Gestational diabetes mellitus in childbirth, diet controlled: Secondary | ICD-10-CM | POA: Diagnosis present

## 2022-09-14 DIAGNOSIS — O24419 Gestational diabetes mellitus in pregnancy, unspecified control: Secondary | ICD-10-CM | POA: Diagnosis present

## 2022-09-14 DIAGNOSIS — R7309 Other abnormal glucose: Secondary | ICD-10-CM | POA: Diagnosis present

## 2022-09-14 DIAGNOSIS — Z7982 Long term (current) use of aspirin: Secondary | ICD-10-CM

## 2022-09-14 DIAGNOSIS — O0993 Supervision of high risk pregnancy, unspecified, third trimester: Secondary | ICD-10-CM

## 2022-09-14 DIAGNOSIS — O24424 Gestational diabetes mellitus in childbirth, insulin controlled: Secondary | ICD-10-CM | POA: Diagnosis not present

## 2022-09-14 DIAGNOSIS — Z8759 Personal history of other complications of pregnancy, childbirth and the puerperium: Secondary | ICD-10-CM | POA: Diagnosis present

## 2022-09-14 LAB — CBC
HCT: 30.5 % — ABNORMAL LOW (ref 36.0–46.0)
Hemoglobin: 9.6 g/dL — ABNORMAL LOW (ref 12.0–15.0)
MCH: 26.6 pg (ref 26.0–34.0)
MCHC: 31.5 g/dL (ref 30.0–36.0)
MCV: 84.5 fL (ref 80.0–100.0)
Platelets: 243 10*3/uL (ref 150–400)
RBC: 3.61 MIL/uL — ABNORMAL LOW (ref 3.87–5.11)
RDW: 12.9 % (ref 11.5–15.5)
WBC: 8 10*3/uL (ref 4.0–10.5)
nRBC: 0 % (ref 0.0–0.2)

## 2022-09-14 LAB — TYPE AND SCREEN
ABO/RH(D): B POS
Antibody Screen: NEGATIVE

## 2022-09-14 LAB — COMPREHENSIVE METABOLIC PANEL
ALT: 15 U/L (ref 0–44)
AST: 18 U/L (ref 15–41)
Albumin: 2.8 g/dL — ABNORMAL LOW (ref 3.5–5.0)
Alkaline Phosphatase: 96 U/L (ref 38–126)
Anion gap: 11 (ref 5–15)
BUN: 7 mg/dL (ref 6–20)
CO2: 20 mmol/L — ABNORMAL LOW (ref 22–32)
Calcium: 8.9 mg/dL (ref 8.9–10.3)
Chloride: 102 mmol/L (ref 98–111)
Creatinine, Ser: 0.69 mg/dL (ref 0.44–1.00)
GFR, Estimated: >60 mL/min (ref >60)
Glucose, Bld: 121 mg/dL — ABNORMAL HIGH (ref 70–99)
Potassium: 3.2 mmol/L — ABNORMAL LOW (ref 3.5–5.1)
Sodium: 133 mmol/L — ABNORMAL LOW (ref 135–145)
Total Bilirubin: 0.5 mg/dL (ref 0.3–1.2)
Total Protein: 6.3 g/dL — ABNORMAL LOW (ref 6.5–8.1)

## 2022-09-14 MED ORDER — FENTANYL CITRATE (PF) 100 MCG/2ML IJ SOLN
50.0000 ug | INTRAMUSCULAR | Status: DC | PRN
  Administered 2022-09-15: 50 ug via INTRAVENOUS
  Filled 2022-09-14: qty 2

## 2022-09-14 MED ORDER — TERBUTALINE SULFATE 1 MG/ML IJ SOLN: 0.2500 mg | Freq: Once | INTRAMUSCULAR | Status: DC | PRN

## 2022-09-14 MED ORDER — LACTATED RINGERS IV SOLN: 500.0000 mL | INTRAVENOUS | Status: DC | PRN

## 2022-09-14 MED ORDER — OXYCODONE-ACETAMINOPHEN 5-325 MG PO TABS: 2.0000 | ORAL_TABLET | ORAL | Status: DC | PRN

## 2022-09-14 MED ORDER — ONDANSETRON HCL 4 MG/2ML IJ SOLN: 4.0000 mg | Freq: Four times a day (QID) | INTRAMUSCULAR | Status: DC | PRN

## 2022-09-14 MED ORDER — OXYTOCIN BOLUS FROM INFUSION
333.0000 mL | Freq: Once | INTRAVENOUS | Status: AC
  Administered 2022-09-15: 333 mL via INTRAVENOUS

## 2022-09-14 MED ORDER — OXYCODONE-ACETAMINOPHEN 5-325 MG PO TABS: 1.0000 | ORAL_TABLET | ORAL | Status: DC | PRN

## 2022-09-14 MED ORDER — MISOPROSTOL 50MCG HALF TABLET
50.0000 ug | ORAL_TABLET | ORAL | Status: DC | PRN
  Administered 2022-09-14: 50 ug via ORAL
  Filled 2022-09-14 (×2): qty 1

## 2022-09-14 MED ORDER — FLEET ENEMA 7-19 GM/118ML RE ENEM: 1.0000 | ENEMA | Freq: Every day | RECTAL | Status: DC | PRN

## 2022-09-14 MED ORDER — MISOPROSTOL 25 MCG QUARTER TABLET: 25.0000 ug | ORAL_TABLET | Freq: Once | ORAL | Status: DC

## 2022-09-14 MED ORDER — OXYTOCIN-SODIUM CHLORIDE 30-0.9 UT/500ML-% IV SOLN: 2.5000 [IU]/h | INTRAVENOUS | Status: DC

## 2022-09-14 MED ORDER — LACTATED RINGERS IV SOLN: INTRAVENOUS | Status: DC

## 2022-09-14 MED ORDER — OXYTOCIN-SODIUM CHLORIDE 30-0.9 UT/500ML-% IV SOLN
1.0000 m[IU]/min | INTRAVENOUS | Status: DC
  Filled 2022-09-14: qty 500

## 2022-09-14 MED ORDER — ZOLPIDEM TARTRATE 5 MG PO TABS: 5.0000 mg | ORAL_TABLET | Freq: Every evening | ORAL | Status: DC | PRN

## 2022-09-14 MED ORDER — MISOPROSTOL 50MCG HALF TABLET: 50.0000 ug | ORAL_TABLET | Freq: Once | ORAL | Status: DC

## 2022-09-14 MED ORDER — ACETAMINOPHEN 325 MG PO TABS: 650.0000 mg | ORAL_TABLET | ORAL | Status: DC | PRN

## 2022-09-14 MED ORDER — SOD CITRATE-CITRIC ACID 500-334 MG/5ML PO SOLN: 30.0000 mL | ORAL | Status: DC | PRN

## 2022-09-14 MED ORDER — LIDOCAINE HCL (PF) 1 % IJ SOLN: 30.0000 mL | INTRAMUSCULAR | Status: DC | PRN

## 2022-09-14 MED ORDER — HYDROXYZINE HCL 50 MG PO TABS
50.0000 mg | ORAL_TABLET | Freq: Four times a day (QID) | ORAL | Status: DC | PRN
  Administered 2022-09-14: 50 mg via ORAL
  Filled 2022-09-14: qty 1

## 2022-09-14 MED ORDER — TERBUTALINE SULFATE 1 MG/ML IJ SOLN
0.2500 mg | Freq: Once | INTRAMUSCULAR | Status: AC | PRN
  Administered 2022-09-15: 0.25 mg via SUBCUTANEOUS
  Filled 2022-09-14: qty 1

## 2022-09-14 NOTE — Plan of Care (Signed)
  Problem: Education: Goal: Knowledge of General Education information will improve Description: Including pain rating scale, medication(s)/side effects and non-pharmacologic comfort measures Outcome: Progressing   Problem: Health Behavior/Discharge Planning: Goal: Ability to manage health-related needs will improve Outcome: Progressing   Problem: Clinical Measurements: Goal: Ability to maintain clinical measurements within normal limits will improve Outcome: Progressing Goal: Will remain free from infection Outcome: Progressing Goal: Diagnostic test results will improve Outcome: Progressing Goal: Respiratory complications will improve Outcome: Progressing Goal: Cardiovascular complication will be avoided Outcome: Progressing   Problem: Activity: Goal: Risk for activity intolerance will decrease Outcome: Progressing   Problem: Nutrition: Goal: Adequate nutrition will be maintained Outcome: Progressing   Problem: Coping: Goal: Level of anxiety will decrease Outcome: Progressing   Problem: Elimination: Goal: Will not experience complications related to bowel motility Outcome: Progressing Goal: Will not experience complications related to urinary retention Outcome: Progressing   Problem: Pain Managment: Goal: General experience of comfort will improve Outcome: Progressing   Problem: Safety: Goal: Ability to remain free from injury will improve Outcome: Progressing   Problem: Skin Integrity: Goal: Risk for impaired skin integrity will decrease Outcome: Progressing   Problem: Education: Goal: Ability to describe self-care measures that may prevent or decrease complications (Diabetes Survival Skills Education) will improve Outcome: Progressing Goal: Individualized Educational Video(s) Outcome: Progressing   Problem: Coping: Goal: Ability to adjust to condition or change in health will improve Outcome: Progressing   Problem: Fluid Volume: Goal: Ability to  maintain a balanced intake and output will improve Outcome: Progressing   Problem: Health Behavior/Discharge Planning: Goal: Ability to identify and utilize available resources and services will improve Outcome: Progressing Goal: Ability to manage health-related needs will improve Outcome: Progressing   Problem: Metabolic: Goal: Ability to maintain appropriate glucose levels will improve Outcome: Progressing   Problem: Nutritional: Goal: Maintenance of adequate nutrition will improve Outcome: Progressing Goal: Progress toward achieving an optimal weight will improve Outcome: Progressing   Problem: Skin Integrity: Goal: Risk for impaired skin integrity will decrease Outcome: Progressing   Problem: Tissue Perfusion: Goal: Adequacy of tissue perfusion will improve Outcome: Progressing   Problem: Education: Goal: Knowledge of Childbirth will improve Outcome: Progressing Goal: Ability to make informed decisions regarding treatment and plan of care will improve Outcome: Progressing Goal: Ability to state and carry out methods to decrease the pain will improve Outcome: Progressing Goal: Individualized Educational Video(s) Outcome: Progressing   Problem: Coping: Goal: Ability to verbalize concerns and feelings about labor and delivery will improve Outcome: Progressing   Problem: Life Cycle: Goal: Ability to make normal progression through stages of labor will improve Outcome: Progressing Goal: Ability to effectively push during vaginal delivery will improve Outcome: Progressing   Problem: Role Relationship: Goal: Will demonstrate positive interactions with the child Outcome: Progressing   Problem: Safety: Goal: Risk of complications during labor and delivery will decrease Outcome: Progressing   Problem: Pain Management: Goal: Relief or control of pain from uterine contractions will improve Outcome: Progressing
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
## 2022-09-15 ENCOUNTER — Encounter (HOSPITAL_COMMUNITY): Payer: Self-pay | Admitting: Obstetrics & Gynecology

## 2022-09-15 ENCOUNTER — Inpatient Hospital Stay (HOSPITAL_COMMUNITY): Payer: 59 | Admitting: Anesthesiology

## 2022-09-15 DIAGNOSIS — O99820 Streptococcus B carrier state complicating pregnancy: Secondary | ICD-10-CM

## 2022-09-15 DIAGNOSIS — O99214 Obesity complicating childbirth: Secondary | ICD-10-CM

## 2022-09-15 DIAGNOSIS — O24424 Gestational diabetes mellitus in childbirth, insulin controlled: Secondary | ICD-10-CM

## 2022-09-15 DIAGNOSIS — Z23 Encounter for immunization: Secondary | ICD-10-CM | POA: Diagnosis not present

## 2022-09-15 DIAGNOSIS — O365930 Maternal care for other known or suspected poor fetal growth, third trimester, not applicable or unspecified: Secondary | ICD-10-CM

## 2022-09-15 DIAGNOSIS — Z3A37 37 weeks gestation of pregnancy: Secondary | ICD-10-CM

## 2022-09-15 LAB — GLUCOSE, CAPILLARY: Glucose-Capillary: 74 mg/dL (ref 70–99)

## 2022-09-15 LAB — RPR: RPR Ser Ql: NONREACTIVE

## 2022-09-15 MED ORDER — OXYCODONE HCL 5 MG PO TABS: 5.0000 mg | ORAL_TABLET | ORAL | Status: DC | PRN

## 2022-09-15 MED ORDER — PRENATAL MULTIVITAMIN CH
1.0000 | ORAL_TABLET | Freq: Every day | ORAL | Status: DC
  Administered 2022-09-15 – 2022-09-17 (×3): 1 via ORAL
  Filled 2022-09-15 (×3): qty 1

## 2022-09-15 MED ORDER — ONDANSETRON HCL 4 MG/2ML IJ SOLN: 4.0000 mg | INTRAMUSCULAR | Status: DC | PRN

## 2022-09-15 MED ORDER — EPHEDRINE 5 MG/ML INJ: 10.0000 mg | INTRAVENOUS | Status: DC | PRN

## 2022-09-15 MED ORDER — WITCH HAZEL-GLYCERIN EX PADS: 1.0000 | MEDICATED_PAD | CUTANEOUS | Status: DC | PRN

## 2022-09-15 MED ORDER — DIBUCAINE (PERIANAL) 1 % EX OINT: 1.0000 | TOPICAL_OINTMENT | CUTANEOUS | Status: DC | PRN

## 2022-09-15 MED ORDER — PENICILLIN G POT IN DEXTROSE 60000 UNIT/ML IV SOLN: 3.0000 10*6.[IU] | INTRAVENOUS | Status: DC

## 2022-09-15 MED ORDER — BENZOCAINE-MENTHOL 20-0.5 % EX AERO: 1.0000 | INHALATION_SPRAY | CUTANEOUS | Status: DC | PRN

## 2022-09-15 MED ORDER — LACTATED RINGERS IV SOLN: 500.0000 mL | Freq: Once | INTRAVENOUS | Status: DC

## 2022-09-15 MED ORDER — SENNOSIDES-DOCUSATE SODIUM 8.6-50 MG PO TABS
2.0000 | ORAL_TABLET | Freq: Every day | ORAL | Status: DC
  Administered 2022-09-16 – 2022-09-17 (×2): 2 via ORAL
  Filled 2022-09-15 (×2): qty 2

## 2022-09-15 MED ORDER — COCONUT OIL OIL: 1.0000 | TOPICAL_OIL | Status: DC | PRN

## 2022-09-15 MED ORDER — TETANUS-DIPHTH-ACELL PERTUSSIS 5-2.5-18.5 LF-MCG/0.5 IM SUSY: 0.5000 mL | PREFILLED_SYRINGE | Freq: Once | INTRAMUSCULAR | Status: DC

## 2022-09-15 MED ORDER — LIDOCAINE HCL (PF) 1 % IJ SOLN
INTRAMUSCULAR | Status: DC | PRN
  Administered 2022-09-15 (×2): 4 mL via EPIDURAL

## 2022-09-15 MED ORDER — DIPHENHYDRAMINE HCL 50 MG/ML IJ SOLN: 12.5000 mg | INTRAMUSCULAR | Status: DC | PRN

## 2022-09-15 MED ORDER — SIMETHICONE 80 MG PO CHEW: 80.0000 mg | CHEWABLE_TABLET | ORAL | Status: DC | PRN

## 2022-09-15 MED ORDER — IBUPROFEN 600 MG PO TABS
600.0000 mg | ORAL_TABLET | Freq: Four times a day (QID) | ORAL | Status: DC
  Administered 2022-09-15 – 2022-09-17 (×9): 600 mg via ORAL
  Filled 2022-09-15 (×9): qty 1

## 2022-09-15 MED ORDER — ONDANSETRON HCL 4 MG PO TABS: 4.0000 mg | ORAL_TABLET | ORAL | Status: DC | PRN

## 2022-09-15 MED ORDER — FENTANYL-BUPIVACAINE-NACL 0.5-0.125-0.9 MG/250ML-% EP SOLN
12.0000 mL/h | EPIDURAL | Status: DC | PRN
  Administered 2022-09-15: 12 mL/h via EPIDURAL
  Filled 2022-09-15: qty 250

## 2022-09-15 MED ORDER — SODIUM CHLORIDE 0.9 % IV SOLN
5.0000 10*6.[IU] | Freq: Once | INTRAVENOUS | Status: AC
  Administered 2022-09-15: 5 10*6.[IU] via INTRAVENOUS
  Filled 2022-09-15: qty 5

## 2022-09-15 MED ORDER — ACETAMINOPHEN 325 MG PO TABS: 650.0000 mg | ORAL_TABLET | ORAL | Status: DC | PRN

## 2022-09-15 MED ORDER — DIPHENHYDRAMINE HCL 25 MG PO CAPS: 25.0000 mg | ORAL_CAPSULE | Freq: Four times a day (QID) | ORAL | Status: DC | PRN

## 2022-09-15 MED ORDER — ZOLPIDEM TARTRATE 5 MG PO TABS: 5.0000 mg | ORAL_TABLET | Freq: Every evening | ORAL | Status: DC | PRN

## 2022-09-15 MED ORDER — PHENYLEPHRINE 80 MCG/ML (10ML) SYRINGE FOR IV PUSH (FOR BLOOD PRESSURE SUPPORT): 80.0000 ug | PREFILLED_SYRINGE | INTRAVENOUS | Status: DC | PRN

## 2022-09-15 MED ORDER — OXYCODONE HCL 5 MG PO TABS: 10.0000 mg | ORAL_TABLET | ORAL | Status: DC | PRN

## 2022-09-15 NOTE — Lactation Note (Signed)
This note was copied from a baby's chart.  NICU Lactation Consultation Note  Patient Name: Boy Gennavieve Lapoint WUJWJ'X Date: 09/15/2022 Age:27 years  Reason for consult: Initial assessment; NICU baby; Early term 65-38.6wks; Maternal endocrine disorder; Other (Comment) (GHTN, IUGR) Type of Endocrine Disorder?: Diabetes (GDM)  SUBJECTIVE  LC met with P2 Mom of ET infant delivered vaginally and transferred to NICU for respiratory support.    Mom desires to breastfeed and formula feed.  LC offered to initiate Mom pumping, explaining the benefit to her milk supply if she could start pumping now, within 6 hours.  Using a pumping top, LC assisted with first pumping using 24 mm flanges.  Encouraged pumping every 2-3 hrs on initiation setting. Encouraged STS with baby when in NICU.   Encouraged Mom to ask for assistance prn.  OBJECTIVE Infant data: Mother's Current Feeding Choice: Breast Milk and Formula  O2 Device: Room Air  Infant feeding assessment No data recorded  Maternal data: G2P2002 Vaginal, Spontaneous Has patient been taught Hand Expression?: Yes Hand Expression Comments: colostrum drops noted Significant Breast History:: no breast changes Current breast feeding challenges:: Infant separation Does the patient have breastfeeding experience prior to this delivery?: Yes How long did the patient breastfeed?: 10 months Pumping frequency: Encouraged Mom to pump every 2-3 hrs between naps Flange Size: 24 Risk factor for low/delayed milk supply:: Infant separation  Pump: Personal, Hands Free (MomCozy through insurance)  No data recorded Maternal: No data recorded INTERVENTIONS/PLAN Interventions: Interventions: Breast feeding basics reviewed; Skin to skin; Breast massage; Hand express; DEBP; Education; [employer] brochure; CDC Guidelines for Breast Pump Cleaning Tools: Pump; Flanges; Hands-free pumping top Pump Education: Setup, frequency, and cleaning; Milk  Storage  Plan: Consult Status: NICU follow-up NICU Follow-up type: New admission follow up   [name] 09/15/2022, 9:54 AM

## 2022-09-15 NOTE — Anesthesia Procedure Notes (Signed)
Epidural Patient location during procedure: OB Start time: 09/15/2022 5:07 AM End time: 09/15/2022 5:12 AM  Staffing Anesthesiologist: Linton Rump, MD Performed: anesthesiologist   Preanesthetic Checklist Completed: patient identified, IV checked, site marked, risks and benefits discussed, surgical consent, monitors and equipment checked, pre-op evaluation and timeout performed  Epidural Patient position: sitting Prep: DuraPrep and site prepped and draped Patient monitoring: continuous pulse ox and blood pressure Approach: midline Location: L3-L4 Injection technique: LOR saline  Needle:  Needle type: Tuohy  Needle gauge: 17 G Needle length: 9 cm and 9 Needle insertion depth: 7.5 cm Catheter type: closed end flexible Catheter size: 19 Gauge Catheter at skin depth: 12 cm Test dose: negative  Assessment Events: blood not aspirated, no cerebrospinal fluid, injection not painful, no injection resistance, no paresthesia and negative IV test  Additional Notes The patient has requested an epidural for labor pain management. Risks and benefits including, but not limited to, infection, bleeding, local anesthetic toxicity, headache, hypotension, back pain, block failure, etc. were discussed with the patient. The patient expressed understanding and consented to the procedure. I confirmed that the patient has no bleeding disorders and is not taking blood thinners. I confirmed the patient's last platelet count with the nurse. A time-out was performed immediately prior to the procedure. Please see nursing documentation for vital signs. Sterile technique was used throughout the whole procedure. Once LOR achieved, the epidural catheter threaded easily without resistance. Aspiration of the catheter was negative for blood and CSF. The epidural was dosed slowly and an infusion was started.  1 attempt(s)Reason for block:procedure for pain
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
## 2022-09-15 NOTE — Anesthesia Postprocedure Evaluation (Signed)
Anesthesia Post Note  Patient: Angel Hull  Procedure(s) Performed: AN AD HOC LABOR EPIDURAL     Patient location during evaluation: Mother Baby Anesthesia Type: Epidural Level of consciousness: awake and alert and oriented Pain management: satisfactory to patient Vital Signs Assessment: post-procedure vital signs reviewed and stable Respiratory status: respiratory function stable Cardiovascular status: stable Postop Assessment: no headache, no backache, epidural receding, patient able to bend at knees, no signs of nausea or vomiting, adequate PO intake and able to ambulate Anesthetic complications: no   No notable events documented.  Last Vitals:  Vitals:   09/15/22 1007 09/15/22 1420  BP: 126/68 126/65  Pulse: 84 69  Resp: 18 18  Temp: 36.8 C 36.8 C  SpO2: 100% 100%    Last Pain:  Vitals:   09/15/22 1420  TempSrc: Oral  PainSc:    Pain Goal:                   Dondra Rhett
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
## 2022-09-16 LAB — CBC
HCT: 26.1 % — ABNORMAL LOW (ref 36.0–46.0)
Hemoglobin: 8.1 g/dL — ABNORMAL LOW (ref 12.0–15.0)
MCH: 25.5 pg — ABNORMAL LOW (ref 26.0–34.0)
MCHC: 31 g/dL (ref 30.0–36.0)
MCV: 82.1 fL (ref 80.0–100.0)
Platelets: 212 10*3/uL (ref 150–400)
RBC: 3.18 MIL/uL — ABNORMAL LOW (ref 3.87–5.11)
RDW: 13 % (ref 11.5–15.5)
WBC: 9.5 10*3/uL (ref 4.0–10.5)
nRBC: 0 % (ref 0.0–0.2)

## 2022-09-16 LAB — BIRTH TISSUE RECOVERY COLLECTION (PLACENTA DONATION)

## 2022-09-16 MED ORDER — IBUPROFEN 600 MG PO TABS: 600.0000 mg | ORAL_TABLET | Freq: Four times a day (QID) | ORAL | 1 refills | Status: DC

## 2022-09-16 MED ORDER — ACETAMINOPHEN 325 MG PO TABS: 650.0000 mg | ORAL_TABLET | ORAL | 1 refills | Status: DC | PRN

## 2022-09-16 MED ORDER — SENNOSIDES-DOCUSATE SODIUM 8.6-50 MG PO TABS: 2.0000 | ORAL_TABLET | Freq: Two times a day (BID) | ORAL | 1 refills | Status: DC | PRN

## 2022-09-16 NOTE — Progress Notes (Signed)
POSTPARTUM PROGRESS NOTE  Post Partum Day 1  Subjective:  Angel Hull is a 27 y.o. W1U2725 s/p SVD at [redacted]w[redacted]d.  She reports she is doing well. No acute events overnight. She denies any problems with ambulating, voiding or po intake. Denies nausea or vomiting. Denies HA, change of vision, RUQ pain, SOB, CP, pedal edema. Pain is well controlled.  Lochia is same as period. Baby still in NICU.  Objective: Blood pressure 131/79, pulse 66, temperature 98.2 F (36.8 C), temperature source Oral, resp. rate 17, height 5\' 11"  (1.803 m), weight 108.2 kg, last menstrual period 12/19/2021, SpO2 100%, unknown if currently breastfeeding.  Physical Exam:  General: alert, cooperative and no distress Chest: no respiratory distress Heart:regular rate, distal pulses intact Uterine Fundus: firm, appropriately tender DVT Evaluation: No calf swelling or tenderness Extremities: no pedal edema Skin: warm, dry  Recent Labs    09/14/22 2025 09/16/22 0531  HGB 9.6* 8.1*  HCT 30.5* 26.1*    Assessment/Plan: Angel Hull is a 27 y.o. D6U4403 s/p SVD at [redacted]w[redacted]d.   PPD#1 - Doing well  Routine postpartum care   GDM: 6wk GTT Anemia: Ferrous Sulfate 325 qMWF Contraception: declines Feeding: breast Dispo: Plan for discharge anticipate d/c home tomorrow.   LOS: 2 days   Paschal Dopp, MD PGY-1 09/16/2022, 6:08 PM

## 2022-09-16 NOTE — Lactation Note (Signed)
This note was copied from a baby's chart.  NICU Lactation Consultation Note  Patient Name: Angel Hull Date: 09/16/2022 Age:26 hours  Reason for consult: Follow-up assessment; NICU baby; Early term 48-38.6wks; Maternal endocrine disorder Type of Endocrine Disorder?: Diabetes (GDM)  SUBJECTIVE  LC in to visit with P2 Mom of ET infant "Angel Hull" in the NICU.   Mom resting in bed.  Mom reports she tried pumping last night but didn't get anything.  Reassured her that this was WNL and if she could increase her pumping to every 3 hrs, her milk volume would be better.  Mom nodded.  LC noted the pump parts on the bedside table and the floor.  LC offered to wash them. LC disassembled the parts and washed, rinsed and placed in separate bin to air dry. Mom encouraged to do STS with baby and offered to assist in NICU.  Mom to ask baby's RN for lactation prn.  OBJECTIVE Infant data: Mother's Current Feeding Choice: Breast Milk and Formula  O2 Device: Room Air  Infant feeding assessment Scale for Readiness: 3 (infant cues, gave paci & infant fell asleep) Scale for Quality: 4 (unable to coordinate suck/swallow/breathe & loss of interest d/t fatigue)   Maternal data: [id number] Vaginal, Spontaneous Has patient been taught Hand Expression?: Yes Hand Expression Comments: colostrum drops noted Significant Breast History:: no breast changes Current breast feeding challenges:: Infant separation Does the patient have breastfeeding experience prior to this delivery?: Yes How long did the patient breastfeed?: 10 months Pumping frequency: Mom has pumped twice, encouraged more frequent pumping with a goal of 6-8 times per 24 hrs Pumped volume: 0 mL (drops) Flange Size: 24 Risk factor for low/delayed milk supply:: Infant separation  Pump: Personal, Hands Free (MomCozy through insurance)   Feeding Status: Scheduled 9-12-3-6 Feeding method: Tube/Gavage (Bolus) Nipple Type: Nfant Slow  Flow (purple)  Maternal: Milk volume: Normal  INTERVENTIONS/PLAN Interventions: Interventions: Skin to skin; Breast massage; Hand express; DEBP Discharge Education: Engorgement and breast care Tools: Flanges; Pump Pump Education: Setup, frequency, and cleaning; Milk Storage  Plan: 1- STS with Angel Hull as much as possible 2- breast massage and hand express often 3- Pump both breasts 8 times per 24 hrs 4- ask for help prn  Consult Status: NICU follow-up NICU Follow-up type: Verify absence of engorgement; Verify onset of copious milk; Maternal D/C visit   Angel Hull 09/16/2022, 9:26 AM

## 2022-09-16 NOTE — Progress Notes (Signed)
Dr.  Ria Clock aware patient wanted to stay.

## 2022-09-16 NOTE — Lactation Note (Addendum)
This note was copied from a baby's chart.  NICU Lactation Consultation Note  Patient Name: Angel Hull [id number] Date: 09/16/2022 Age:26 hours  Reason for consult: Follow-up assessment; RN request; NICU baby; Early term 11-38.6wks; Infant < 6lbs; Maternal endocrine disorder; Breastfeeding assistance Type of Endocrine Disorder?: Diabetes  SUBJECTIVE  LC called to assist with breastfeeding.  Mom teary- eyed, but not talking about how she is feeling. LC offered to help with positioning and latching to the breast and Mom just sat there.  LC explained that she wanted to help her, only if she wanted to breastfeed.  It took a little while before Mom got up and went to bathroom and came back to chair.  Pillow support placed in Mom's lap to elevate baby.  Mom with large, heavy breasts.  LC encouraged and assisted Mom to support her breast to aid in baby sustaining his latch.   Baby latched easily with LC assistance, educating Mom with every step.  Mom assisted to support baby's head from ear to ear, blanket support placed under baby's head.    Mom would drop her hand supporting her breast and baby would lose the deep latch.  Hand expression revealed drops of colostrum.  Baby re-latched well.  Baby sucking with jaw extensions and was rhythmic.  Unable to identify regular swallows.  Mom taught to use breast compression to increase milk transfer.  Baby fed 10 mins on right breast and then with continued cueing, latched to left breast for another 10 mins.  Baby sucking consistently, not needing any stimulation to continue.   RN notified of LC recommending full gavage.  Mom hasn't been pumping, and milk supply is not in yet.  Talked to Mom regarding pumping will help prevent engorgement, and help to build her milk supply.  OBJECTIVE Infant data: Mother's Current Feeding Choice: Breast Milk and Formula  O2 Device: Room Air  Infant feeding assessment Scale for Readiness: 2 Scale for Quality: 4  (unable to coordinate suck/swallow/breathe & loss of interest d/t fatigue)   Maternal data: D6U4403 Vaginal, Spontaneous Has patient been taught Hand Expression?: Yes Hand Expression Comments: colostrum drops noted Significant Breast History:: no breast changes Current breast feeding challenges:: Infant separation Does the patient have breastfeeding experience prior to this delivery?: Yes How long did the patient breastfeed?: 10 months Pumping frequency: Mom has pumped twice Pumped volume: 0 mL Flange Size: 24 Risk factor for low/delayed milk supply:: Infant separation  Pump: Personal, Hands Free (MomCozy through insurance)  ASSESSMENT Infant: Latch: Grasps breast easily, tongue down, lips flanged, rhythmical sucking. Audible Swallowing: None  Type of Nipple: Everted at rest and after stimulation Comfort (Breast/Nipple): Soft / non-tender Hold (Positioning): Assistance needed to correctly position infant at breast and maintain latch. LATCH Score: 7  Feeding Status: Scheduled 9-12-3-6 Feeding method: Breast Nipple Type: Nfant Extra Slow Flow (gold)  Maternal: Milk volume: Normal  INTERVENTIONS/PLAN Interventions: Interventions: Assisted with latch; Skin to skin; Breast massage; Hand express; Adjust position; Support pillows; Position options; Breast compression Discharge Education: Engorgement and breast care Tools: Pump; Flanges Pump Education: Setup, frequency, and cleaning; Milk Storage  Plan: Consult Status: NICU follow-up NICU Follow-up type: Maternal D/C visit; Verify onset of copious milk; Verify absence of engorgement   [name] 09/16/2022, 12:53 PM
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
## 2022-09-17 ENCOUNTER — Other Ambulatory Visit (HOSPITAL_COMMUNITY): Payer: Self-pay

## 2022-09-17 DIAGNOSIS — D509 Iron deficiency anemia, unspecified: Secondary | ICD-10-CM | POA: Insufficient documentation

## 2022-09-17 MED ORDER — FUROSEMIDE 20 MG PO TABS: 20.0000 mg | ORAL_TABLET | Freq: Every day | ORAL | 0 refills | Status: DC

## 2022-09-17 MED ORDER — FERROUS SULFATE 325 (65 FE) MG PO TBEC: 325.0000 mg | DELAYED_RELEASE_TABLET | Freq: Every day | ORAL | 0 refills | Status: DC

## 2022-09-17 MED ORDER — FERROUS SULFATE 325 (65 FE) MG PO TBEC
325.0000 mg | DELAYED_RELEASE_TABLET | Freq: Every day | ORAL | 3 refills | Status: DC
  Filled 2022-09-17: qty 100, 100d supply, fill #0

## 2022-09-17 MED ORDER — FUROSEMIDE 20 MG PO TABS
20.0000 mg | ORAL_TABLET | Freq: Every day | ORAL | Status: DC
  Administered 2022-09-17: 20 mg via ORAL
  Filled 2022-09-17: qty 1
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
## 2022-09-17 NOTE — Lactation Note (Signed)
This note was copied from a baby's chart.  NICU Lactation Consultation Note  Patient Name: Angel Hull Date: 09/17/2022 Age:26 hours  Reason for consult: Follow-up assessment; NICU baby; Early term 74-38.6wks; Infant < 6lbs; Maternal endocrine disorder; Other (Comment); Maternal discharge (SGA) Type of Endocrine Disorder?: Diabetes (GDM)  SUBJECTIVE Visited with family of 101 hours old ETI NICU female, she's a P2 and reports she's been pumping but not getting any drops of colostrum yet, unless she hand-expresses. Noticed that pumping is still not consistent, explained the importance of consistent pumping for the onset of lactogenesis II and the prevention of engorgement. She voiced baby "Angel Hull" has been too sleepy to go to breast; asked her to call for assistance when needed. She's getting [facility] the [facility]. Reviewed discharge education, lactogenesis II, pumping schedule, pump settings and anticipatory guidelines.  OBJECTIVE Infant data: Mother's Current Feeding Choice: Breast Milk and Formula  O2 Device: Room Air  Infant feeding assessment Scale for Readiness: 3 Scale for Quality: 3   Maternal data: [id number] Vaginal, Spontaneous Current breast feeding challenges:: NICU admission Pumping frequency: 3 times/24 hours Pumped volume: 0 mL (but gets drops with hand expression) Flange Size: 24 Risk factor for low/delayed milk supply:: GDM, SGA, infant separation  Pump: Personal, Hands Free (MomCozy through insurance)  ASSESSMENT Infant: Feeding Status: Scheduled 9-12-3-6 Feeding method: Tube/Gavage (Bolus) Nipple Type: Nfant Extra Slow Flow (gold)  Maternal: Milk volume: Normal  INTERVENTIONS/PLAN Interventions: Interventions: Breast feeding basics reviewed; DEBP; Education Discharge Education: Engorgement and breast care Tools: Pump; Flanges; Hands-free pumping top Pump Education: Setup, frequency, and cleaning; Milk Storage  Plan: Encouraged  pumping every 3 hours, ideally 8 pumping sessions/24 hours Breast massage and hand expression were also encouraged prior pumping She'll take all pump parts to baby's room after her discharge She'll switch her pump settings from initiate to maintain mode once she starts getting 20 ml of EBM combined  She'll call for assistance if/when needed to take baby to breast, if baby is too sleepy to latch, she'll engage in STS care  No other support person at this time. All questions and concerns answered, family to contact [facility] services PRN.  Consult Status: NICU follow-up NICU Follow-up type: Verify onset of copious milk; Verify absence of engorgement   Angel Hull 09/17/2022, 11:36 AM

## 2022-09-21 ENCOUNTER — Ambulatory Visit (HOSPITAL_COMMUNITY): Payer: Self-pay

## 2022-09-21 NOTE — Lactation Note (Signed)
This note was copied from a baby's chart.  NICU Lactation Consultation Note  Patient Name: Angel Hull Date: 09/21/2022 Age:26 days  Reason for consult: Follow-up assessment; NICU baby; RN request; Maternal endocrine disorder; Early term 37-38.6wks Type of Endocrine Disorder?: Diabetes  SUBJECTIVE  LC in to assist with positioning and latching to the breast.  Baby has been getting some bottles, last feeding baby took 27 ml po.  Baby showing feeding cues.  LC helped with cross cradle hold STS.  LC assisted Mom to support her breast and sandwich the fullness in a U hold and hold baby's head ear to ear.  Baby wanting to latch to nipple.  Education on importance of a wide open mouth prior to bringing baby quickly to breast.  LC taught Mom to use alternate breast compression to increase milk transfer at the breast. Baby able to attain a deep latch and consistently suck/swallow with no signs of stress for 6 mins.  Baby slipped onto nipple, needing a chin tug to open mouth wider and flange lower lip.  LC initiated a 20 mm nipple shield to see if this would aid in sustaining a deeper latch, but baby was fatigued.  RN notified of need to gavage 2/3 of feeding.   OBJECTIVE Infant data: No data recorded O2 Device: Room Air  Infant feeding assessment Scale for Readiness: 2 Scale for Quality: 2   Maternal data: [id number] Vaginal, Spontaneous Pumping frequency: 8 times per 24 hrs Pumped volume: 120 mL Flange Size: 24  Pump: Personal, Hands Free (MomCozy through insurance)  ASSESSMENT Infant: Latch: Grasps breast easily, tongue down, lips flanged, rhythmical sucking. Audible Swallowing: Spontaneous and intermittent Type of Nipple: Everted at rest and after stimulation Comfort (Breast/Nipple): Soft / non-tender Hold (Positioning): Assistance needed to correctly position infant at breast and maintain latch. LATCH Score: 9  Feeding Status: Scheduled 9-12-3-6 Feeding method:  Breast Nipple Type: (S) Dr. Irving Burton Ultra Preemie  Maternal: Milk volume: Normal  INTERVENTIONS/PLAN Interventions: Interventions: Breast feeding basics reviewed; Assisted with latch; Skin to skin; Breast massage; Hand express; Support pillows; Position options; Breast compression; DEBP; Education Tools: Pump; Flanges; Bottle; Nipple Shields Pump Education: Setup, frequency, and cleaning; Milk Storage Nipple shield size: 20  Plan:  1- STS with baby as much as possible 2- Offer the breast with feeding cues, making sure baby is not latched to nipple only.  Sandwich breast and tug on chin to flange lower lip prn 3- Pump both breasts 15-30 mins after STS/breastfeeding   Consult Status: NICU follow-up NICU Follow-up type: Assist with IDF-2 (Mother does not need to pre-pump before breastfeeding)   Angel Hull 09/21/2022, 12:23 PM

## 2022-09-22 ENCOUNTER — Encounter: Payer: 59 | Admitting: Obstetrics and Gynecology

## 2022-09-29 ENCOUNTER — Other Ambulatory Visit: Payer: Self-pay

## 2022-09-29 ENCOUNTER — Ambulatory Visit (INDEPENDENT_AMBULATORY_CARE_PROVIDER_SITE_OTHER): Payer: 59

## 2022-09-29 VITALS — BP 125/79 | HR 89 | Wt 213.2 lb

## 2022-09-29 DIAGNOSIS — Z0130 Encounter for examination of blood pressure without abnormal findings: Secondary | ICD-10-CM

## 2022-09-29 NOTE — Progress Notes (Unsigned)
Blood Pressure Check Visit  Angel Hull is here for blood pressure check following {Desc; delivery type:31066} on ***. BP today is ***. Patient denies/endorses any {Symptoms; hypertension cc:11129:s:"dizzness","blurred vision","headache","shortness of breath","peripheral edema"}. Reviewed with ***.  Marjo Bicker, RN 09/29/2022  3:28 PM

## 2022-10-15 ENCOUNTER — Encounter: Payer: Self-pay | Admitting: Family Medicine

## 2022-10-15 ENCOUNTER — Other Ambulatory Visit: Payer: Self-pay

## 2022-10-15 ENCOUNTER — Ambulatory Visit (INDEPENDENT_AMBULATORY_CARE_PROVIDER_SITE_OTHER): Payer: 59 | Admitting: Family Medicine

## 2022-10-15 DIAGNOSIS — D509 Iron deficiency anemia, unspecified: Secondary | ICD-10-CM

## 2022-10-15 DIAGNOSIS — Z8759 Personal history of other complications of pregnancy, childbirth and the puerperium: Secondary | ICD-10-CM

## 2022-10-15 LAB — POCT HEMOGLOBIN-HEMACUE: Hemoglobin: 10.2 g/dL — ABNORMAL LOW (ref 12.0–15.0)

## 2022-10-15 MED ORDER — FERROUS SULFATE 325 (65 FE) MG PO TBEC: 325.0000 mg | DELAYED_RELEASE_TABLET | ORAL | 0 refills | Status: DC
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
## 2022-12-28 ENCOUNTER — Telehealth (INDEPENDENT_AMBULATORY_CARE_PROVIDER_SITE_OTHER): Payer: 59 | Admitting: Primary Care

## 2022-12-28 ENCOUNTER — Ambulatory Visit (INDEPENDENT_AMBULATORY_CARE_PROVIDER_SITE_OTHER): Payer: Self-pay

## 2022-12-28 DIAGNOSIS — K5900 Constipation, unspecified: Secondary | ICD-10-CM | POA: Diagnosis not present

## 2022-12-28 DIAGNOSIS — D509 Iron deficiency anemia, unspecified: Secondary | ICD-10-CM | POA: Diagnosis not present

## 2022-12-28 MED ORDER — MAGNESIUM OXIDE -MG SUPPLEMENT 200 MG PO CHEW: 200.0000 mg | CHEWABLE_TABLET | Freq: Every evening | ORAL | 1 refills | Status: DC | PRN
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
## 2022-12-28 NOTE — Telephone Encounter (Signed)
  Chief Complaint: lower abdominal/pelvic pain Symptoms: burning sensation, constipation Frequency: 2 days Pertinent Negatives: Patient denies radiating or back pian, vomiting, nausea Disposition: [] ED /[] Urgent Care (no appt availability in office) / [x] Appointment(In office/virtual)/ []  [facility]/ [] Home Care/ [] Refused Recommended Disposition /[]  Mobile Bus/ []  Follow-up with PCP Additional Notes: scheduled in office appt  Reason for Disposition  [1] MILD-MODERATE pain AND [2] constant AND [3] present > 2 hours  Answer Assessment - Initial Assessment Questions 1. LOCATION: "Where does it hurt?"      Lower abdomen  2. RADIATION: "Does the pain shoot anywhere else?" (e.g., chest, back)     *No Answer* 3. ONSET: "When did the pain begin?" (e.g., minutes, hours or days ago)      2 4. SUDDEN: "Gradual or sudden onset?"     *No Answer* 5. PATTERN "Does the pain come and go, or is it constant?"    - If it comes and goes: "How long does it last?" "Do you have pain now?"     (Note: Comes and goes means the pain is intermittent. It goes away completely between bouts.)    - If constant: "Is it getting better, staying the same, or getting worse?"      (Note: Constant means the pain never goes away completely; most serious pain is constant and gets worse.)      Comes and goes 6. SEVERITY: "How bad is the pain?"  (e.g., Scale 1-10; mild, moderate, or severe)    - MILD (1-3): Doesn't interfere with normal activities, abdomen soft and not tender to touch.     - MODERATE (4-7): Interferes with normal activities or awakens from sleep, abdomen tender to touch.     - SEVERE (8-10): Excruciating pain, doubled over, unable to do any normal activities.       Burning- severe 7. RECURRENT SYMPTOM: "Have you ever had this type of stomach pain before?" If Yes, ask: "When was the last time?" and "What happened that time?"      no 8. CAUSE: "What do you think is causing the stomach  pain?"     unsure  10. OTHER SYMPTOMS: "Do you have any other symptoms?" (e.g., back pain, diarrhea, fever, urination pain, vomiting)       constipation  Protocols used: Abdominal Pain - Female-A-AH

## 2023-04-01 ENCOUNTER — Ambulatory Visit (INDEPENDENT_AMBULATORY_CARE_PROVIDER_SITE_OTHER): Admitting: Primary Care

## 2023-04-05 ENCOUNTER — Telehealth (INDEPENDENT_AMBULATORY_CARE_PROVIDER_SITE_OTHER): Payer: Self-pay | Admitting: Primary Care

## 2023-04-05 NOTE — Telephone Encounter (Signed)
 Called pt to remind them about appt. Pt did not answer and could not leave VM.

## 2023-04-06 ENCOUNTER — Other Ambulatory Visit (HOSPITAL_COMMUNITY)
Admission: RE | Admit: 2023-04-06 | Discharge: 2023-04-06 | Disposition: A | Discharge status: Home / Self Care | Source: Ambulatory Visit | Attending: Primary Care | Admitting: Primary Care

## 2023-04-06 ENCOUNTER — Ambulatory Visit (INDEPENDENT_AMBULATORY_CARE_PROVIDER_SITE_OTHER)

## 2023-04-06 DIAGNOSIS — Z113 Encounter for screening for infections with a predominantly sexual mode of transmission: Secondary | ICD-10-CM

## 2023-04-06 DIAGNOSIS — Z30019 Encounter for initial prescription of contraceptives, unspecified: Secondary | ICD-10-CM

## 2023-04-06 LAB — POCT URINE PREGNANCY: Preg Test, Ur: NEGATIVE

## 2023-04-06 NOTE — Progress Notes (Signed)
 Pt came into the office for poct pregnancy test for birth control and a vaginal swab to check for BV and etc

## 2023-04-07 LAB — CERVICOVAGINAL ANCILLARY ONLY
Bacterial Vaginitis (gardnerella): POSITIVE — AB
Candida Glabrata: NEGATIVE
Candida Vaginitis: NEGATIVE
Chlamydia: NEGATIVE
Comment: NEGATIVE
Comment: NEGATIVE
Comment: NEGATIVE
Comment: NEGATIVE
Comment: NEGATIVE
Comment: NORMAL
Neisseria Gonorrhea: NEGATIVE
Trichomonas: NEGATIVE

## 2023-04-09 ENCOUNTER — Other Ambulatory Visit (INDEPENDENT_AMBULATORY_CARE_PROVIDER_SITE_OTHER): Payer: Self-pay | Admitting: Primary Care

## 2023-04-09 ENCOUNTER — Encounter (INDEPENDENT_AMBULATORY_CARE_PROVIDER_SITE_OTHER): Payer: Self-pay | Admitting: Primary Care

## 2023-04-09 DIAGNOSIS — B9689 Other specified bacterial agents as the cause of diseases classified elsewhere: Secondary | ICD-10-CM

## 2023-04-09 MED ORDER — METRONIDAZOLE 500 MG PO TABS
500.0000 mg | ORAL_TABLET | Freq: Two times a day (BID) | ORAL | 0 refills | Status: DC
Start: 2023-04-09 — End: 2023-08-02

## 2023-04-26 ENCOUNTER — Other Ambulatory Visit (INDEPENDENT_AMBULATORY_CARE_PROVIDER_SITE_OTHER): Payer: Self-pay | Admitting: Primary Care

## 2023-04-26 DIAGNOSIS — B9689 Other specified bacterial agents as the cause of diseases classified elsewhere: Secondary | ICD-10-CM

## 2023-04-26 NOTE — Telephone Encounter (Signed)
 Copied from CRM [id number]. Topic: Clinical - Medication Refill >> Apr 26, 2023 10:51 AM [name] wrote: Pt called requesting to resume her birth control pills however she does not know the name of them and was prescribed them years ago. Recently had a negative pregnancy test   Medication Refill - Most Recent Primary Care Visit:  Provider: Herbert Deaner Department: [facility] Visit Type: NURSE VISIT Date: 04/06/2023  Medication: metroNIDAZOLE (FLAGYL) 500 MG tablet + birth control   Has the patient contacted their pharmacy? Yes (Agent: If no, request that the patient contact the pharmacy for the refill. If patient does not wish to contact the pharmacy document the reason why and proceed with request.) (Agent: If yes, when and what did the pharmacy advise?)  Is this the correct pharmacy for this prescription? Yes If no, delete pharmacy and type the correct one.  This is the patient's preferred pharmacy:   [facility] [address], [place] - [address] AT [facility] [address] [place] [address] Phone: [phone] Fax: [phone]  Has the prescription been filled recently? Yes  Is the patient out of the medication? Yes  Has the patient been seen for an appointment in the last year OR does the patient have an upcoming appointment? Yes  Can we respond through MyChart? Yes  Agent: Please be advised that Rx refills may take up to 3 business days. We ask that you follow-up with your pharmacy.

## 2023-04-27 NOTE — Telephone Encounter (Signed)
 Requested medication (s) are due for refill today: na  Requested medication (s) are on the active medication list: yes  Last refill:  04/09/23 #14 0 refills  Future visit scheduled: no   Notes to clinic:  medication not assigned to a protocol. Do you want to refill Rx?     Requested Prescriptions  Pending Prescriptions Disp Refills   metroNIDAZOLE (FLAGYL) 500 MG tablet 14 tablet 0    Sig: Take 1 tablet (500 mg total) by mouth 2 (two) times daily.     Off-Protocol Failed - 04/27/2023 12:19 PM      Failed - Medication not assigned to a protocol, review manually.      Passed - Valid encounter within last 12 months    Recent Outpatient Visits           4 months ago Constipation, unspecified constipation type   [place] [facility] Grayce Sessions, NP   1 year ago Syncope and collapse   [place] [facility] Grayce Sessions, NP   1 year ago BV (bacterial vaginosis)   [place] [facility] Grayce Sessions, NP   1 year ago Cervical cancer screening   [place] [facility] Grayce Sessions, NP   1 year ago Constipation, unspecified constipation type   [place] [facility] Grayce Sessions, NP

## 2023-04-28 NOTE — Telephone Encounter (Signed)
 Requested medication (s) are due for refill today: na   Requested medication (s) are on the active medication list: yes   Last refill:  04/09/23 #14 0 refills  Future visit scheduled: no   Notes to clinic:  medication not assigned to a protocol. Do you want to refill Rx?     Requested Prescriptions  Pending Prescriptions Disp Refills   metroNIDAZOLE (FLAGYL) 500 MG tablet 14 tablet 0    Sig: Take 1 tablet (500 mg total) by mouth 2 (two) times daily.     Off-Protocol Failed - 04/28/2023  8:06 AM      Failed - Medication not assigned to a protocol, review manually.      Passed - Valid encounter within last 12 months    Recent Outpatient Visits           4 months ago Constipation, unspecified constipation type   [place] [facility] Grayce Sessions, NP   1 year ago Syncope and collapse   [place] [facility] Grayce Sessions, NP   1 year ago BV (bacterial vaginosis)   [place] [facility] Grayce Sessions, NP   1 year ago Cervical cancer screening   [place] [facility] Grayce Sessions, NP   1 year ago Constipation, unspecified constipation type   [place] [facility] Grayce Sessions, NP

## 2023-08-02 ENCOUNTER — Inpatient Hospital Stay (HOSPITAL_COMMUNITY)
Admission: AD | Admit: 2023-08-02 | Discharge: 2023-08-02 | Disposition: A | Discharge status: Home / Self Care | Attending: Obstetrics and Gynecology | Admitting: Obstetrics and Gynecology

## 2023-08-02 ENCOUNTER — Inpatient Hospital Stay (HOSPITAL_COMMUNITY)

## 2023-08-02 ENCOUNTER — Encounter (HOSPITAL_COMMUNITY): Payer: Self-pay | Admitting: Obstetrics and Gynecology

## 2023-08-02 DIAGNOSIS — O3680X0 Pregnancy with inconclusive fetal viability, not applicable or unspecified: Secondary | ICD-10-CM

## 2023-08-02 DIAGNOSIS — B3731 Acute candidiasis of vulva and vagina: Secondary | ICD-10-CM

## 2023-08-02 DIAGNOSIS — O23591 Infection of other part of genital tract in pregnancy, first trimester: Secondary | ICD-10-CM | POA: Diagnosis not present

## 2023-08-02 DIAGNOSIS — R109 Unspecified abdominal pain: Secondary | ICD-10-CM | POA: Diagnosis present

## 2023-08-02 DIAGNOSIS — Z3A01 Less than 8 weeks gestation of pregnancy: Secondary | ICD-10-CM | POA: Diagnosis not present

## 2023-08-02 LAB — URINALYSIS, ROUTINE W REFLEX MICROSCOPIC
Bilirubin Urine: NEGATIVE
Glucose, UA: NEGATIVE mg/dL
Hgb urine dipstick: NEGATIVE
Ketones, ur: NEGATIVE mg/dL
Leukocytes,Ua: NEGATIVE
Nitrite: NEGATIVE
Protein, ur: NEGATIVE mg/dL
Specific Gravity, Urine: 1.02 (ref 1.005–1.030)
pH: 6 (ref 5.0–8.0)

## 2023-08-02 LAB — WET PREP, GENITAL
Clue Cells Wet Prep HPF POC: NONE SEEN
Sperm: NONE SEEN
Trich, Wet Prep: NONE SEEN
WBC, Wet Prep HPF POC: <10 (ref <10)

## 2023-08-02 LAB — POCT PREGNANCY, URINE: Preg Test, Ur: POSITIVE — AB

## 2023-08-02 LAB — CBC
HCT: 31.6 % — ABNORMAL LOW (ref 36.0–46.0)
Hemoglobin: 9.4 g/dL — ABNORMAL LOW (ref 12.0–15.0)
MCH: 22.6 pg — ABNORMAL LOW (ref 26.0–34.0)
MCHC: 29.7 g/dL — ABNORMAL LOW (ref 30.0–36.0)
MCV: 76 fL — ABNORMAL LOW (ref 80.0–100.0)
Platelets: 294 K/uL (ref 150–400)
RBC: 4.16 MIL/uL (ref 3.87–5.11)
RDW: 16.4 % — ABNORMAL HIGH (ref 11.5–15.5)
WBC: 5.6 K/uL (ref 4.0–10.5)
nRBC: 0 % (ref 0.0–0.2)

## 2023-08-02 LAB — HCG, QUANTITATIVE, PREGNANCY: hCG, Beta Chain, Quant, S: 440 m[IU]/mL — ABNORMAL HIGH (ref <5)

## 2023-08-02 MED ORDER — TERCONAZOLE 0.8 % VA CREA: 1.0000 | TOPICAL_CREAM | Freq: Every day | VAGINAL | 0 refills | Status: DC

## 2023-08-02 NOTE — MAU Note (Signed)
 Angel Hull is a 28 y.o. at [redacted]w[redacted]d here in MAU reporting: went to  Urgent care  for abd pain and burning with urination x 2-3 days.  They did pregnancy test and it was positive. Pt reports she had some spotting 2 days ago none today.   LMP: 6/16/205 Onset of complaint: 2-3 days Pain score: 7 Vitals:   08/02/23 1722  BP: (!) 142/79  Pulse: 95  Resp: 18  Temp: 99.1 F (37.3 C)     FHT: n/a  Lab orders placed from triage: u/a, wet prep, gc
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
## 2023-08-02 NOTE — Progress Notes (Signed)
 Abdominal pain painful urination constipation for about 3 weeks

## 2023-08-02 NOTE — Progress Notes (Signed)
 Subjective  Patient is a 28 y.o. female here c/w abdominal pain x 2 - 3 weeks.  She is concerned about UTI / kidney stones.  LMP 4 weeks ago.  Pain is 8/10.  Located lower abdominal region.    Review of Systems  Constitutional:  Negative for chills, fatigue and fever.  Gastrointestinal:  Positive for abdominal pain and constipation. Negative for diarrhea, nausea and vomiting.  Genitourinary:  Negative for decreased urine volume, dyspareunia, dysuria, flank pain, frequency, hematuria, urgency, vaginal bleeding, vaginal discharge and vaginal pain.  Musculoskeletal:  Negative for arthralgias, back pain and myalgias.  Skin:  Negative for rash.  Allergic/Immunologic: Negative for environmental allergies and immunocompromised state.  Neurological:  Negative for light-headedness and headaches.  Hematological:  Negative for adenopathy. Does not bruise/bleed easily.  Psychiatric/Behavioral:  Negative for confusion and sleep disturbance.        Current Problem List: There is no problem list on file for this patient.   Current Medications on file: No current outpatient medications on file prior to visit.   No current facility-administered medications on file prior to visit.    Past Medical History: History reviewed. No pertinent past medical history.   Past Surgical History: History reviewed. No pertinent surgical history.  Social History: Social History   Socioeconomic History  . Marital status: Single    Spouse name: Not on file  . Number of children: Not on file  . Years of education: Not on file  . Highest education level: Not on file  Occupational History  . Not on file  Tobacco Use  . Smoking status: Never  . Smokeless tobacco: Never  Substance and Sexual Activity  . Alcohol use: Not on file  . Drug use: Not on file  . Sexual activity: Not on file  Other Topics Concern  . Not on file  Social History Narrative  . Not on file    Allergies: No Known  Allergies  Objective  Vitals:   08/02/23 1343  BP: (!) 146/92  BP Location: Right arm  Pulse: 89  Resp: 18  Temp: 36.7 C (98.1 F)  TempSrc: Oral  SpO2: 100%  Weight: 93.4 kg  Height: 5' 11     No results found.      Physical Exam Vitals and nursing note reviewed.  Constitutional:      General: She is not in acute distress.    Appearance: Normal appearance. She is not ill-appearing, toxic-appearing or diaphoretic.  HENT:     Head: Normocephalic and atraumatic.     Nose: Nose normal.  Eyes:     General: No scleral icterus.    Extraocular Movements: Extraocular movements intact.     Conjunctiva/sclera: Conjunctivae normal.  Pulmonary:     Effort: Pulmonary effort is normal. No respiratory distress.  Abdominal:     General: There is no distension.     Tenderness: There is abdominal tenderness in the right lower quadrant and left lower quadrant. There is no right CVA tenderness, left CVA tenderness, guarding or rebound.  Musculoskeletal:        General: No deformity.     Cervical back: Normal range of motion. No rigidity.  Skin:    Coloration: Skin is not jaundiced.     Findings: No rash.  Neurological:     Mental Status: She is alert.     Motor: No weakness.     Gait: Gait normal.  Psychiatric:        Mood and Affect: Mood normal.  Behavior: Behavior normal.      Results: Results for orders placed or performed in visit on 08/02/23  POCT urinalysis dipstick manually resulted  Component Result   Color, UA Yellow   Clarity, UA Clear   Glucose, UA Negative   Bilirubin, UA Negative   Ketones, UA Negative   Spec Grav, UA >=1.030 (A)   Blood, UA Negative   pH, UA 6.0   Protein, UA Negative   Urobilinogen, UA Negative   Leukocytes, UA Negative   Nitrite, UA Negative  POCT pregnancy, urine manually resulted  Component Result   Preg Test, Ur Positive (A)   Internal Quality Control Pass       Assessment  Ashli was seen today for abdominal  pain. Diagnoses and all orders for this visit: Lower abdominal pain (Primary) -     POCT urinalysis dipstick manually resulted -     POCT pregnancy, urine manually resulted Positive pregnancy test -     POCT urinalysis dipstick manually resulted -     POCT pregnancy, urine manually resulted   Sent to ED for further evaluation due to lower abdominal pain and positive pregnancy test      MDM:     1 Undiagnosed new problem with uncertain prognosis     Risk:: Moderate

## 2023-08-02 NOTE — Discharge Instructions (Signed)
Return to care  If you have heavier bleeding that soaks through more than 2 pads per hour for an hour or more If you bleed so much that you feel like you might pass out or you do pass out If you have significant abdominal pain that is not improved with Tylenol   

## 2023-08-03 LAB — GC/CHLAMYDIA PROBE AMP (~~LOC~~) NOT AT ARMC
Chlamydia: NEGATIVE
Comment: NEGATIVE
Comment: NORMAL
Neisseria Gonorrhea: NEGATIVE

## 2023-08-05 ENCOUNTER — Ambulatory Visit (INDEPENDENT_AMBULATORY_CARE_PROVIDER_SITE_OTHER): Payer: Self-pay

## 2023-08-05 ENCOUNTER — Other Ambulatory Visit: Payer: Self-pay

## 2023-08-05 ENCOUNTER — Ambulatory Visit: Payer: Self-pay | Admitting: Family Medicine

## 2023-08-05 VITALS — BP 122/68 | HR 86 | Ht 71.0 in | Wt 204.4 lb

## 2023-08-05 DIAGNOSIS — Z3A01 Less than 8 weeks gestation of pregnancy: Secondary | ICD-10-CM

## 2023-08-05 DIAGNOSIS — O3680X0 Pregnancy with inconclusive fetal viability, not applicable or unspecified: Secondary | ICD-10-CM

## 2023-08-05 LAB — BETA HCG QUANT (REF LAB): hCG Quant: 1438 m[IU]/mL

## 2023-08-05 NOTE — Progress Notes (Unsigned)
 Pt here today for STAT Beta s/p pregnancy of unknown location.  Pt denies VB and is having some mild pain that feels like menstrual like cramping.  Pt advised that we will call later today with results and f/u.  BP RA 148/69.  Rpt BP 122/68.    Per Dr.Eckstat HCG rising appropriately Please call patient and let them know this appears to be a normal pregnancy. Please schedule them for a viability scan in 2 weeks, ensure they are on prenatal vitamins, and schedule for new OB intake or provide list of area OB providers  Attempted to contact x 3 unable to reach as receiving message that states please try your call again later.  MyChart message sent.   Angel Bazinet,RN  08/05/23

## 2023-08-06 ENCOUNTER — Other Ambulatory Visit: Payer: Self-pay

## 2023-08-06 ENCOUNTER — Other Ambulatory Visit: Payer: Self-pay | Admitting: Family Medicine

## 2023-08-06 DIAGNOSIS — Z3492 Encounter for supervision of normal pregnancy, unspecified, second trimester: Secondary | ICD-10-CM

## 2023-08-06 DIAGNOSIS — O3680X0 Pregnancy with inconclusive fetal viability, not applicable or unspecified: Secondary | ICD-10-CM

## 2023-08-12 ENCOUNTER — Ambulatory Visit (INDEPENDENT_AMBULATORY_CARE_PROVIDER_SITE_OTHER): Admitting: Primary Care

## 2023-08-18 ENCOUNTER — Ambulatory Visit

## 2023-08-18 ENCOUNTER — Other Ambulatory Visit: Payer: Self-pay

## 2023-08-18 DIAGNOSIS — Z3491 Encounter for supervision of normal pregnancy, unspecified, first trimester: Secondary | ICD-10-CM

## 2023-08-18 DIAGNOSIS — Z3A01 Less than 8 weeks gestation of pregnancy: Secondary | ICD-10-CM | POA: Diagnosis not present

## 2023-08-18 DIAGNOSIS — O3680X0 Pregnancy with inconclusive fetal viability, not applicable or unspecified: Secondary | ICD-10-CM

## 2023-08-31 ENCOUNTER — Ambulatory Visit: Payer: Self-pay | Admitting: Family Medicine

## 2023-09-01 ENCOUNTER — Ambulatory Visit (HOSPITAL_COMMUNITY)
Admission: EM | Admit: 2023-09-01 | Discharge: 2023-09-01 | Disposition: A | Discharge status: Home / Self Care | Attending: Physician Assistant | Admitting: Physician Assistant

## 2023-09-01 ENCOUNTER — Other Ambulatory Visit: Payer: Self-pay | Admitting: Family Medicine

## 2023-09-01 ENCOUNTER — Encounter (HOSPITAL_COMMUNITY): Payer: Self-pay

## 2023-09-01 DIAGNOSIS — N898 Other specified noninflammatory disorders of vagina: Secondary | ICD-10-CM | POA: Diagnosis present

## 2023-09-01 DIAGNOSIS — Z3A08 8 weeks gestation of pregnancy: Secondary | ICD-10-CM

## 2023-09-01 DIAGNOSIS — R8290 Unspecified abnormal findings in urine: Secondary | ICD-10-CM | POA: Diagnosis present

## 2023-09-01 DIAGNOSIS — O2311 Infections of bladder in pregnancy, first trimester: Secondary | ICD-10-CM | POA: Diagnosis present

## 2023-09-01 DIAGNOSIS — Z3492 Encounter for supervision of normal pregnancy, unspecified, second trimester: Secondary | ICD-10-CM

## 2023-09-01 LAB — POCT URINALYSIS DIP (MANUAL ENTRY)
Bilirubin, UA: NEGATIVE
Blood, UA: NEGATIVE
Glucose, UA: NEGATIVE mg/dL
Ketones, POC UA: NEGATIVE mg/dL
Nitrite, UA: NEGATIVE
Protein Ur, POC: NEGATIVE mg/dL
Spec Grav, UA: 1.02 (ref 1.010–1.025)
Urobilinogen, UA: 0.2 U/dL
pH, UA: 7 (ref 5.0–8.0)

## 2023-09-01 MED ORDER — CEFPODOXIME PROXETIL 100 MG PO TABS: 100.0000 mg | ORAL_TABLET | Freq: Two times a day (BID) | ORAL | 0 refills | Status: DC

## 2023-09-01 NOTE — ED Triage Notes (Addendum)
 Pt c/o vaginal d/c with odor and urine has foul odor x2wks. States has had unprotected intercourse. States is [redacted]wks pregnant.
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
## 2023-09-01 NOTE — Discharge Instructions (Addendum)
 We are treating you for urinary tract infection.  Take cefpodoxime  twice daily for 7 days.  We will contact you if we need to change or add a medication based on your swab and urine culture results.  Make sure you drink plenty of fluid.  If you have any worsening symptoms including abdominal pain, fever, nausea/vomiting intravenous oral intake, weakness, abnormal vaginal bleeding you need to be seen immediately.

## 2023-09-02 ENCOUNTER — Ambulatory Visit (HOSPITAL_COMMUNITY): Payer: Self-pay

## 2023-09-02 LAB — CERVICOVAGINAL ANCILLARY ONLY
Bacterial Vaginitis (gardnerella): POSITIVE — AB
Candida Glabrata: NEGATIVE
Candida Vaginitis: POSITIVE — AB
Chlamydia: NEGATIVE
Comment: NEGATIVE
Comment: NEGATIVE
Comment: NEGATIVE
Comment: NEGATIVE
Comment: NEGATIVE
Comment: NORMAL
Neisseria Gonorrhea: NEGATIVE
Trichomonas: POSITIVE — AB

## 2023-09-02 MED ORDER — CLOTRIMAZOLE 3 2 % VA CREA: 1.0000 | TOPICAL_CREAM | Freq: Every day | VAGINAL | 0 refills | Status: AC

## 2023-09-02 MED ORDER — METRONIDAZOLE 500 MG PO TABS: 500.0000 mg | ORAL_TABLET | Freq: Two times a day (BID) | ORAL | 0 refills | Status: AC

## 2023-09-06 LAB — URINE CULTURE: Culture: >=100000 — AB

## 2023-09-07 ENCOUNTER — Ambulatory Visit: Admitting: Nurse Practitioner

## 2024-02-11 ENCOUNTER — Other Ambulatory Visit (HOSPITAL_COMMUNITY)
Admission: RE | Admit: 2024-02-11 | Discharge: 2024-02-11 | Disposition: A | Source: Ambulatory Visit | Attending: Family Medicine | Admitting: Family Medicine

## 2024-02-11 ENCOUNTER — Other Ambulatory Visit: Payer: Self-pay

## 2024-02-11 ENCOUNTER — Encounter: Payer: Self-pay | Admitting: Family Medicine

## 2024-02-11 ENCOUNTER — Ambulatory Visit: Payer: Self-pay | Admitting: Family Medicine

## 2024-02-11 VITALS — BP 135/88 | HR 104 | Wt 234.5 lb

## 2024-02-11 DIAGNOSIS — Z8759 Personal history of other complications of pregnancy, childbirth and the puerperium: Secondary | ICD-10-CM | POA: Diagnosis not present

## 2024-02-11 DIAGNOSIS — Z8632 Personal history of gestational diabetes: Secondary | ICD-10-CM

## 2024-02-11 DIAGNOSIS — Z3A31 31 weeks gestation of pregnancy: Secondary | ICD-10-CM | POA: Diagnosis not present

## 2024-02-11 DIAGNOSIS — Z3490 Encounter for supervision of normal pregnancy, unspecified, unspecified trimester: Secondary | ICD-10-CM

## 2024-02-11 DIAGNOSIS — D509 Iron deficiency anemia, unspecified: Secondary | ICD-10-CM

## 2024-02-11 DIAGNOSIS — O0990 Supervision of high risk pregnancy, unspecified, unspecified trimester: Secondary | ICD-10-CM | POA: Diagnosis present

## 2024-02-11 DIAGNOSIS — O0993 Supervision of high risk pregnancy, unspecified, third trimester: Secondary | ICD-10-CM | POA: Diagnosis not present

## 2024-02-11 LAB — CERVICOVAGINAL ANCILLARY ONLY
Bacterial Vaginitis (gardnerella): POSITIVE — AB
Candida Glabrata: NEGATIVE
Candida Vaginitis: POSITIVE — AB
Chlamydia: NEGATIVE
Comment: NEGATIVE
Comment: NEGATIVE
Comment: NEGATIVE
Comment: NEGATIVE
Comment: NEGATIVE
Comment: NORMAL
Neisseria Gonorrhea: NEGATIVE
Trichomonas: NEGATIVE

## 2024-02-12 LAB — CBC/D/PLT+RPR+RH+ABO+RUBIGG...
Antibody Screen: NEGATIVE
Basophils Absolute: 0 10*3/uL (ref 0.0–0.2)
Basos: 0 %
EOS (ABSOLUTE): 0.1 10*3/uL (ref 0.0–0.4)
Eos: 1 %
HCV Ab: NONREACTIVE
HIV Screen 4th Generation wRfx: NONREACTIVE
Hematocrit: 29 % — ABNORMAL LOW (ref 34.0–46.6)
Hemoglobin: 8.8 g/dL — ABNORMAL LOW (ref 11.1–15.9)
Hepatitis B Surface Ag: NEGATIVE
Immature Grans (Abs): 0 10*3/uL (ref 0.0–0.1)
Immature Granulocytes: 0 %
Lymphocytes Absolute: 1.4 10*3/uL (ref 0.7–3.1)
Lymphs: 22 %
MCH: 24.9 pg — ABNORMAL LOW (ref 26.6–33.0)
MCHC: 30.3 g/dL — ABNORMAL LOW (ref 31.5–35.7)
MCV: 82 fL (ref 79–97)
Monocytes Absolute: 0.6 10*3/uL (ref 0.1–0.9)
Monocytes: 10 %
Neutrophils Absolute: 4.1 10*3/uL (ref 1.4–7.0)
Neutrophils: 67 %
Platelets: 268 10*3/uL (ref 150–450)
RBC: 3.53 x10E6/uL — ABNORMAL LOW (ref 3.77–5.28)
RDW: 14.2 % (ref 11.7–15.4)
RPR Ser Ql: NONREACTIVE
Rh Factor: POSITIVE
Rubella Antibodies, IGG: 1.18 {index}
WBC: 6.2 10*3/uL (ref 3.4–10.8)

## 2024-02-12 LAB — COMPREHENSIVE METABOLIC PANEL WITH GFR
ALT: 10 [IU]/L (ref 0–32)
AST: 17 [IU]/L (ref 0–40)
Albumin: 3.7 g/dL — ABNORMAL LOW (ref 4.0–5.0)
Alkaline Phosphatase: 105 [IU]/L (ref 41–116)
BUN/Creatinine Ratio: 8 — ABNORMAL LOW (ref 9–23)
BUN: 5 mg/dL — ABNORMAL LOW (ref 6–20)
Bilirubin Total: 0.6 mg/dL (ref 0.0–1.2)
CO2: 18 mmol/L — ABNORMAL LOW (ref 20–29)
Calcium: 9.1 mg/dL (ref 8.7–10.2)
Chloride: 102 mmol/L (ref 96–106)
Creatinine, Ser: 0.59 mg/dL (ref 0.57–1.00)
Globulin, Total: 2.5 g/dL (ref 1.5–4.5)
Glucose: 75 mg/dL (ref 70–99)
Potassium: 4 mmol/L (ref 3.5–5.2)
Sodium: 135 mmol/L (ref 134–144)
Total Protein: 6.2 g/dL (ref 6.0–8.5)
eGFR: 127 mL/min/{1.73_m2}

## 2024-02-12 LAB — PROTEIN / CREATININE RATIO, URINE
Creatinine, Urine: 82.9 mg/dL
Protein, Ur: 10 mg/dL
Protein/Creat Ratio: 121 mg/g{creat} (ref 0–200)

## 2024-02-12 LAB — HEMOGLOBIN A1C
Est. average glucose Bld gHb Est-mCnc: 114 mg/dL
Hgb A1c MFr Bld: 5.6 % (ref 4.8–5.6)

## 2024-02-12 LAB — HCV INTERPRETATION

## 2024-02-12 LAB — TSH: TSH: 0.803 u[IU]/mL (ref 0.450–4.500)

## 2024-02-14 LAB — CULTURE, OB URINE

## 2024-02-14 LAB — URINE CULTURE, OB REFLEX

## 2024-02-15 ENCOUNTER — Other Ambulatory Visit: Payer: Self-pay

## 2024-02-15 ENCOUNTER — Ambulatory Visit: Payer: Self-pay | Admitting: Family Medicine

## 2024-02-15 DIAGNOSIS — O0990 Supervision of high risk pregnancy, unspecified, unspecified trimester: Secondary | ICD-10-CM

## 2024-02-16 LAB — GLUCOSE TOLERANCE, 2 HOURS W/ 1HR
Glucose, 1 hour: 173 mg/dL (ref 70–179)
Glucose, 2 hour: 122 mg/dL (ref 70–152)
Glucose, Fasting: 69 mg/dL — ABNORMAL LOW (ref 70–91)
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
## 2024-02-17 ENCOUNTER — Encounter: Payer: Self-pay | Admitting: *Deleted

## 2024-02-22 ENCOUNTER — Telehealth: Payer: Self-pay | Admitting: Pharmacy Technician

## 2024-02-22 ENCOUNTER — Other Ambulatory Visit (HOSPITAL_COMMUNITY): Payer: Self-pay | Admitting: Obstetrics and Gynecology

## 2024-02-22 ENCOUNTER — Encounter: Payer: Self-pay | Admitting: Obstetrics and Gynecology

## 2024-02-22 ENCOUNTER — Ambulatory Visit: Admitting: Obstetrics and Gynecology

## 2024-02-22 VITALS — BP 110/66 | HR 114 | Wt 237.0 lb

## 2024-02-22 DIAGNOSIS — Z8759 Personal history of other complications of pregnancy, childbirth and the puerperium: Secondary | ICD-10-CM

## 2024-02-22 DIAGNOSIS — O0990 Supervision of high risk pregnancy, unspecified, unspecified trimester: Secondary | ICD-10-CM

## 2024-02-22 DIAGNOSIS — Z3A33 33 weeks gestation of pregnancy: Secondary | ICD-10-CM | POA: Insufficient documentation

## 2024-02-22 DIAGNOSIS — O0933 Supervision of pregnancy with insufficient antenatal care, third trimester: Secondary | ICD-10-CM | POA: Insufficient documentation

## 2024-02-22 DIAGNOSIS — B3731 Acute candidiasis of vulva and vagina: Secondary | ICD-10-CM

## 2024-02-22 DIAGNOSIS — D509 Iron deficiency anemia, unspecified: Secondary | ICD-10-CM

## 2024-02-22 DIAGNOSIS — B9689 Other specified bacterial agents as the cause of diseases classified elsewhere: Secondary | ICD-10-CM

## 2024-02-22 MED ORDER — METRONIDAZOLE 500 MG PO TABS: 500.0000 mg | ORAL_TABLET | Freq: Two times a day (BID) | ORAL | 0 refills | Status: AC

## 2024-02-22 MED ORDER — MICONAZOLE NITRATE 2 % VA CREA: 1.0000 | TOPICAL_CREAM | Freq: Every day | VAGINAL | 2 refills | Status: AC

## 2024-02-23 ENCOUNTER — Other Ambulatory Visit

## 2024-02-23 ENCOUNTER — Ambulatory Visit: Payer: Self-pay | Admitting: Obstetrics and Gynecology

## 2024-02-23 ENCOUNTER — Ambulatory Visit: Admitting: Obstetrics and Gynecology

## 2024-02-23 VITALS — BP 125/70 | HR 105

## 2024-02-23 DIAGNOSIS — D649 Anemia, unspecified: Secondary | ICD-10-CM | POA: Diagnosis not present

## 2024-02-23 DIAGNOSIS — Z3A33 33 weeks gestation of pregnancy: Secondary | ICD-10-CM | POA: Diagnosis not present

## 2024-02-23 DIAGNOSIS — O09299 Supervision of pregnancy with other poor reproductive or obstetric history, unspecified trimester: Secondary | ICD-10-CM | POA: Diagnosis not present

## 2024-02-23 DIAGNOSIS — Z8632 Personal history of gestational diabetes: Secondary | ICD-10-CM

## 2024-02-23 DIAGNOSIS — O0990 Supervision of high risk pregnancy, unspecified, unspecified trimester: Secondary | ICD-10-CM

## 2024-02-23 DIAGNOSIS — O0933 Supervision of pregnancy with insufficient antenatal care, third trimester: Secondary | ICD-10-CM

## 2024-02-23 DIAGNOSIS — O09293 Supervision of pregnancy with other poor reproductive or obstetric history, third trimester: Secondary | ICD-10-CM

## 2024-02-23 DIAGNOSIS — O99013 Anemia complicating pregnancy, third trimester: Secondary | ICD-10-CM

## 2024-02-23 LAB — ANEMIA PROFILE B
Basophils Absolute: 0 10*3/uL (ref 0.0–0.2)
Basos: 1 %
EOS (ABSOLUTE): 0.1 10*3/uL (ref 0.0–0.4)
Eos: 1 %
Ferritin: 20 ng/mL (ref 15–150)
Folate: 10.5 ng/mL
Hematocrit: 28.5 % — ABNORMAL LOW (ref 34.0–46.6)
Hemoglobin: 8.5 g/dL — ABNORMAL LOW (ref 11.1–15.9)
Immature Grans (Abs): 0.1 10*3/uL (ref 0.0–0.1)
Immature Granulocytes: 1 %
Iron Saturation: 4 % — CL (ref 15–55)
Iron: 24 ug/dL — ABNORMAL LOW (ref 27–159)
Lymphocytes Absolute: 1.4 10*3/uL (ref 0.7–3.1)
Lymphs: 16 %
MCH: 24.6 pg — ABNORMAL LOW (ref 26.6–33.0)
MCHC: 29.8 g/dL — ABNORMAL LOW (ref 31.5–35.7)
MCV: 83 fL (ref 79–97)
Monocytes Absolute: 0.9 10*3/uL (ref 0.1–0.9)
Monocytes: 10 %
Neutrophils Absolute: 6.1 10*3/uL (ref 1.4–7.0)
Neutrophils: 71 %
Platelets: 287 10*3/uL (ref 150–450)
RBC: 3.45 x10E6/uL — ABNORMAL LOW (ref 3.77–5.28)
RDW: 14 % (ref 11.7–15.4)
Retic Ct Pct: 1.7 % (ref 0.6–2.6)
Total Iron Binding Capacity: 556 ug/dL (ref 250–450)
UIBC: 532 ug/dL — ABNORMAL HIGH (ref 131–425)
Vitamin B-12: 595 pg/mL (ref 232–1245)
WBC: 8.4 10*3/uL (ref 3.4–10.8)

## 2024-02-23 LAB — COMPREHENSIVE METABOLIC PANEL WITH GFR
ALT: 11 [IU]/L (ref 0–32)
AST: 16 [IU]/L (ref 0–40)
Albumin: 3.4 g/dL — ABNORMAL LOW (ref 4.0–5.0)
Alkaline Phosphatase: 114 [IU]/L (ref 41–116)
BUN/Creatinine Ratio: 9 (ref 9–23)
BUN: 6 mg/dL (ref 6–20)
Bilirubin Total: 0.6 mg/dL (ref 0.0–1.2)
CO2: 18 mmol/L — ABNORMAL LOW (ref 20–29)
Calcium: 9.2 mg/dL (ref 8.7–10.2)
Chloride: 103 mmol/L (ref 96–106)
Creatinine, Ser: 0.69 mg/dL (ref 0.57–1.00)
Globulin, Total: 2.4 g/dL (ref 1.5–4.5)
Glucose: 92 mg/dL (ref 70–99)
Potassium: 3.8 mmol/L (ref 3.5–5.2)
Sodium: 138 mmol/L (ref 134–144)
Total Protein: 5.8 g/dL — ABNORMAL LOW (ref 6.0–8.5)
eGFR: 122 mL/min/{1.73_m2}

## 2024-02-23 LAB — PANORAMA PRENATAL TEST FULL PANEL:PANORAMA TEST PLUS 5 ADDITIONAL MICRODELETIONS: FETAL FRACTION: 23

## 2024-02-23 NOTE — Progress Notes (Signed)
 After review, MFM consult with provider is not indicated for today  Arna Ranks, MD 02/23/2024 4:07 PM  [facility]

## 2024-03-07 ENCOUNTER — Encounter: Payer: Self-pay | Admitting: Certified Nurse Midwife
